# Patient Record
Sex: Male | Born: 1950 | Race: Black or African American | Hispanic: No | Smoking: Current some day smoker
Health system: Southern US, Community
[De-identification: ages and names within clinical notes are randomized; demographics above are authoritative.]

## PROBLEM LIST (undated history)

## (undated) DIAGNOSIS — I10 Essential (primary) hypertension: Secondary | ICD-10-CM

## (undated) DIAGNOSIS — J449 Chronic obstructive pulmonary disease, unspecified: Secondary | ICD-10-CM

## (undated) DIAGNOSIS — E785 Hyperlipidemia, unspecified: Secondary | ICD-10-CM

## (undated) DIAGNOSIS — J9600 Acute respiratory failure, unspecified whether with hypoxia or hypercapnia: Secondary | ICD-10-CM

## (undated) HISTORY — DX: Acute respiratory failure, unspecified whether with hypoxia or hypercapnia: J96.00

## (undated) HISTORY — PX: STOMACH SURGERY: SHX791

## (undated) HISTORY — DX: Essential (primary) hypertension: I10

## (undated) HISTORY — PX: TONSILLECTOMY: SUR1361

## (undated) HISTORY — DX: Chronic obstructive pulmonary disease, unspecified: J44.9

---

## 2000-05-25 ENCOUNTER — Encounter: Admission: RE | Admit: 2000-05-25 | Discharge: 2000-05-25 | Payer: Self-pay | Admitting: *Deleted

## 2004-05-18 ENCOUNTER — Inpatient Hospital Stay (HOSPITAL_COMMUNITY): Admission: EM | Admit: 2004-05-18 | Discharge: 2004-05-24 | Payer: Self-pay | Admitting: Emergency Medicine

## 2005-06-30 ENCOUNTER — Emergency Department (HOSPITAL_COMMUNITY): Admission: EM | Admit: 2005-06-30 | Discharge: 2005-06-30 | Payer: Self-pay | Admitting: Emergency Medicine

## 2008-10-30 ENCOUNTER — Inpatient Hospital Stay (HOSPITAL_COMMUNITY): Admission: EM | Admit: 2008-10-30 | Discharge: 2008-11-04 | Payer: Self-pay | Admitting: Emergency Medicine

## 2008-10-30 ENCOUNTER — Ambulatory Visit: Payer: Self-pay | Admitting: Pulmonary Disease

## 2008-10-31 ENCOUNTER — Encounter (INDEPENDENT_AMBULATORY_CARE_PROVIDER_SITE_OTHER): Payer: Self-pay | Admitting: Emergency Medicine

## 2008-11-20 ENCOUNTER — Ambulatory Visit: Payer: Self-pay | Admitting: Family Medicine

## 2008-11-23 ENCOUNTER — Ambulatory Visit: Payer: Self-pay | Admitting: *Deleted

## 2008-12-01 ENCOUNTER — Telehealth: Payer: Self-pay | Admitting: Emergency Medicine

## 2008-12-07 DIAGNOSIS — I10 Essential (primary) hypertension: Secondary | ICD-10-CM | POA: Insufficient documentation

## 2008-12-07 DIAGNOSIS — J449 Chronic obstructive pulmonary disease, unspecified: Secondary | ICD-10-CM | POA: Insufficient documentation

## 2008-12-07 DIAGNOSIS — J9600 Acute respiratory failure, unspecified whether with hypoxia or hypercapnia: Secondary | ICD-10-CM

## 2008-12-07 DIAGNOSIS — J45909 Unspecified asthma, uncomplicated: Secondary | ICD-10-CM | POA: Insufficient documentation

## 2008-12-08 ENCOUNTER — Ambulatory Visit: Payer: Self-pay | Admitting: Emergency Medicine

## 2008-12-25 ENCOUNTER — Telehealth (INDEPENDENT_AMBULATORY_CARE_PROVIDER_SITE_OTHER): Payer: Self-pay | Admitting: *Deleted

## 2008-12-27 ENCOUNTER — Ambulatory Visit: Payer: Self-pay | Admitting: Emergency Medicine

## 2009-01-10 ENCOUNTER — Ambulatory Visit: Payer: Self-pay | Admitting: Emergency Medicine

## 2009-01-15 ENCOUNTER — Telehealth (INDEPENDENT_AMBULATORY_CARE_PROVIDER_SITE_OTHER): Payer: Self-pay | Admitting: *Deleted

## 2009-02-01 ENCOUNTER — Emergency Department (HOSPITAL_COMMUNITY): Admission: EM | Admit: 2009-02-01 | Discharge: 2009-02-01 | Payer: Self-pay | Admitting: Emergency Medicine

## 2009-02-05 ENCOUNTER — Ambulatory Visit: Payer: Self-pay | Admitting: Emergency Medicine

## 2009-02-15 ENCOUNTER — Telehealth: Payer: Self-pay | Admitting: Internal Medicine

## 2009-03-06 ENCOUNTER — Ambulatory Visit: Payer: Self-pay | Admitting: Family Medicine

## 2009-03-06 ENCOUNTER — Encounter (INDEPENDENT_AMBULATORY_CARE_PROVIDER_SITE_OTHER): Payer: Self-pay | Admitting: Adult Health

## 2009-03-06 LAB — CONVERTED CEMR LAB
Albumin: 4.5 g/dL (ref 3.5–5.2)
CO2: 22 meq/L (ref 19–32)
Cholesterol: 226 mg/dL — ABNORMAL HIGH (ref 0–200)
Creatinine, Ser: 1.29 mg/dL (ref 0.40–1.50)
HDL: 63 mg/dL (ref >39)
LDL Cholesterol: 144 mg/dL — ABNORMAL HIGH (ref 0–99)
Microalb, Ur: 0.77 mg/dL (ref 0.00–1.89)
Potassium: 4.2 meq/L (ref 3.5–5.3)
Total CHOL/HDL Ratio: 3.6
Total Protein: 7.7 g/dL (ref 6.0–8.3)

## 2009-03-14 ENCOUNTER — Ambulatory Visit: Payer: Self-pay | Admitting: Internal Medicine

## 2009-04-09 ENCOUNTER — Ambulatory Visit: Payer: Self-pay | Admitting: Emergency Medicine

## 2009-04-09 DIAGNOSIS — J309 Allergic rhinitis, unspecified: Secondary | ICD-10-CM | POA: Insufficient documentation

## 2009-05-08 ENCOUNTER — Ambulatory Visit: Payer: Self-pay | Admitting: Internal Medicine

## 2009-05-09 ENCOUNTER — Telehealth (INDEPENDENT_AMBULATORY_CARE_PROVIDER_SITE_OTHER): Payer: Self-pay | Admitting: *Deleted

## 2009-06-07 ENCOUNTER — Ambulatory Visit: Payer: Self-pay | Admitting: Emergency Medicine

## 2009-06-27 ENCOUNTER — Ambulatory Visit: Payer: Self-pay | Admitting: Internal Medicine

## 2009-08-27 ENCOUNTER — Telehealth (INDEPENDENT_AMBULATORY_CARE_PROVIDER_SITE_OTHER): Payer: Self-pay | Admitting: *Deleted

## 2009-09-05 ENCOUNTER — Ambulatory Visit: Payer: Self-pay | Admitting: Internal Medicine

## 2009-09-07 ENCOUNTER — Encounter (INDEPENDENT_AMBULATORY_CARE_PROVIDER_SITE_OTHER): Payer: Self-pay | Admitting: Adult Health

## 2009-09-07 ENCOUNTER — Ambulatory Visit: Payer: Self-pay | Admitting: Internal Medicine

## 2009-09-07 LAB — CONVERTED CEMR LAB
ALT: 11 units/L (ref 0–53)
BUN: 19 mg/dL (ref 6–23)
Basophils Absolute: 0 10*3/uL (ref 0.0–0.1)
Basophils Relative: 0 % (ref 0–1)
CO2: 24 meq/L (ref 19–32)
Calcium: 9.5 mg/dL (ref 8.4–10.5)
Cholesterol: 192 mg/dL (ref 0–200)
Eosinophils Relative: 2 % (ref 0–5)
Glucose, Bld: 90 mg/dL (ref 70–99)
HDL: 52 mg/dL (ref >39)
Hemoglobin: 14.8 g/dL (ref 13.0–17.0)
LDL Cholesterol: 117 mg/dL — ABNORMAL HIGH (ref 0–99)
Lymphocytes Relative: 39 % (ref 12–46)
Lymphs Abs: 3.8 10*3/uL (ref 0.7–4.0)
MCHC: 33.5 g/dL (ref 30.0–36.0)
Monocytes Absolute: 0.8 10*3/uL (ref 0.1–1.0)
Neutro Abs: 5.1 10*3/uL (ref 1.7–7.7)
Neutrophils Relative %: 51 % (ref 43–77)
RBC: 4.58 M/uL (ref 4.22–5.81)
Triglycerides: 116 mg/dL (ref <150)
VLDL: 23 mg/dL (ref 0–40)

## 2009-09-10 ENCOUNTER — Ambulatory Visit: Payer: Self-pay | Admitting: Emergency Medicine

## 2009-09-13 ENCOUNTER — Ambulatory Visit (HOSPITAL_COMMUNITY): Admission: RE | Admit: 2009-09-13 | Discharge: 2009-09-13 | Payer: Self-pay | Admitting: Internal Medicine

## 2009-09-19 ENCOUNTER — Telehealth (INDEPENDENT_AMBULATORY_CARE_PROVIDER_SITE_OTHER): Payer: Self-pay | Admitting: *Deleted

## 2009-10-18 ENCOUNTER — Ambulatory Visit: Payer: Self-pay | Admitting: Vascular Surgery

## 2009-11-01 ENCOUNTER — Telehealth (INDEPENDENT_AMBULATORY_CARE_PROVIDER_SITE_OTHER): Payer: Self-pay | Admitting: *Deleted

## 2009-11-01 ENCOUNTER — Encounter: Payer: Self-pay | Admitting: Emergency Medicine

## 2009-11-06 ENCOUNTER — Ambulatory Visit (HOSPITAL_COMMUNITY): Admission: RE | Admit: 2009-11-06 | Discharge: 2009-11-06 | Payer: Self-pay | Admitting: Family Medicine

## 2009-11-16 ENCOUNTER — Telehealth (INDEPENDENT_AMBULATORY_CARE_PROVIDER_SITE_OTHER): Payer: Self-pay | Admitting: *Deleted

## 2009-12-10 ENCOUNTER — Ambulatory Visit: Payer: Self-pay | Admitting: Emergency Medicine

## 2009-12-26 ENCOUNTER — Emergency Department (HOSPITAL_COMMUNITY): Admission: EM | Admit: 2009-12-26 | Discharge: 2009-12-26 | Payer: Self-pay | Admitting: Emergency Medicine

## 2009-12-31 ENCOUNTER — Telehealth: Payer: Self-pay | Admitting: Emergency Medicine

## 2010-01-02 ENCOUNTER — Ambulatory Visit (HOSPITAL_BASED_OUTPATIENT_CLINIC_OR_DEPARTMENT_OTHER): Admission: RE | Admit: 2010-01-02 | Discharge: 2010-01-02 | Payer: Self-pay | Admitting: Emergency Medicine

## 2010-01-02 ENCOUNTER — Ambulatory Visit: Payer: Self-pay | Admitting: Pulmonary Disease

## 2010-01-15 ENCOUNTER — Ambulatory Visit (HOSPITAL_COMMUNITY)
Admission: RE | Admit: 2010-01-15 | Discharge: 2010-01-15 | Payer: Self-pay | Source: Home / Self Care | Admitting: Internal Medicine

## 2010-01-15 ENCOUNTER — Encounter: Payer: Self-pay | Admitting: Internal Medicine

## 2010-02-16 ENCOUNTER — Emergency Department (HOSPITAL_COMMUNITY)
Admission: EM | Admit: 2010-02-16 | Discharge: 2010-02-16 | Payer: Self-pay | Source: Home / Self Care | Admitting: Emergency Medicine

## 2010-02-18 ENCOUNTER — Ambulatory Visit: Payer: Self-pay | Admitting: Emergency Medicine

## 2010-02-21 DIAGNOSIS — J189 Pneumonia, unspecified organism: Secondary | ICD-10-CM | POA: Insufficient documentation

## 2010-02-28 ENCOUNTER — Ambulatory Visit
Admission: RE | Admit: 2010-02-28 | Discharge: 2010-02-28 | Payer: Self-pay | Source: Home / Self Care | Attending: Emergency Medicine | Admitting: Emergency Medicine

## 2010-02-28 ENCOUNTER — Ambulatory Visit: Payer: Self-pay | Admitting: Emergency Medicine

## 2010-04-04 NOTE — Progress Notes (Signed)
Summary: refill  Phone Note Call from Patient Call back at Home Phone [phone]   Caller: Patient Call For: byrum Summary of Call: pt says that [facility] told him they had faxed a request for refill of symbicort.  Initial call taken by: Tivis Ringer, CNA,  December 31, 2009 5:03 PM  Follow-up for Phone Call        refill sent. pt aware. Carron Curie CMA  December 31, 2009 5:19 PM     Prescriptions: SYMBICORT 160-4.5 MCG/ACT AERO (BUDESONIDE-FORMOTEROL FUMARATE) 2 puffs two times a day  #1 x 11   Entered by:   Carron Curie CMA   Authorized by:   Leslye Peer MD   Signed by:   Carron Curie CMA on 12/31/2009   Method used:   Faxed to ...       [facility] - [facility] (retail)       [address] [place], [place]  [address]       Ph: [phone]       Fax: [phone]   RxID:   [id number]

## 2010-04-04 NOTE — Letter (Signed)
Summary: External Correspondence  External Correspondence   Imported By: Lehman Prom 11/01/2009 15:02:39  _____________________________________________________________________  External Attachment:    Type:   Image     Comment:   External Document

## 2010-04-04 NOTE — Assessment & Plan Note (Signed)
Summary: COPD, GERD   Visit Type:  Follow-up Primary Provider/Referring Provider:  [name], [facility]  CC:  COPD.  The patient says his breathing is worse in the mornings. He notices the SOB when still lying in bed. Patient is smoking again.Marland Kitchen  History of Present Illness: 10 hx current tobacco, COPD +/- asthma, HTN. Little other PMH.   ROV 01/10/09 -- Returns for scheduled follow up, has been doing very well. Taking the Spiriva + Symbicort. He feels much better on this regimen. Able to walk farther. Using rescue inhaler MUCH less than last time. Down to 2 cigarettes a day.   ROV 02/05/09 -- Seen back today, earlier than originally planned, after an asthma flare that sent him to the ED 12/3. Was experiencing several periods of wheezing a day, was using ventolin 6 times a day. He has been around a smoker every day, states he quit 2 weeks ago. He was sengt out on a quick prednisone taper. Much improved now.   ROV 04/09/09 -- returns feeling well, breathing has been stable since he was treated for an AE in early January (pred + azithro). May have also had thrush, treated with fluconazole. He had an episode of acute dyspnea following exposure to bug spray about a week ago. No tobacco. Some cough with clear thick sputum.   ROV 06/07/09 -- follow up for COPD. He tells me he was treated with prednisone for a flare in March. He is on Spiriva and Symbicort, but has run out of Spiriva for 3 days. He has to get his social services card renewed, planning to get this done today. He is feeling well now, good exertional tolerance. Quit smoking 2 weeks ago!!   ROV 09/10/09 -- Regular f/u for COPD. He has had nocturnal awakenings with SOB. Often assoc with reflux symptoms. He has also been having syncopal episodes, planning for vascular eval, cardiology eval. Nocturnal cough, wakes up choking. Still can have episodes of SOB with exertion. Back to smoking 5 cig/day. No exacerbations since last visit.   Preventive  Screening-Counseling & Management  Alcohol-Tobacco     Smoking Status: current     Packs/Day: <0.25  Current Medications (verified): 1)  Ventolin Hfa 108 (90 Base) Mcg/act Aers (Albuterol Sulfate) .Marland Kitchen.. 1-2 Puffs Every 4-6 Hours As Needed 2)  Amlodipine Besylate 10 Mg Tabs (Amlodipine Besylate) .... Once Daily 3)  Hydrochlorothiazide 25 Mg Tabs (Hydrochlorothiazide) .... Once Daily 4)  Spiriva Handihaler 18 Mcg Caps (Tiotropium Bromide Monohydrate) .... Out of Medication 5)  Famotidine 20 Mg Tabs (Famotidine) .... Two Times A Day 6)  Symbicort 160-4.5 Mcg/act Aero (Budesonide-Formoterol Fumarate) .... 2 Puffs Two Times A Day 7)  Nasacort Aq 55 Mcg/act Aers (Triamcinolone Acetonide(Nasal)) .... 2 Sprays in Each Nostril Daily As Needed  Allergies (verified): No Known Drug Allergies  Vital Signs:  Patient profile:   60 year old male Height:      72 inches (182.88 cm) Weight:      176.38 pounds (80.17 kg) BMI:     24.01 O2 Sat:      93 % on Room air Temp:     97.5 degrees F (36.39 degrees C) oral Pulse rate:   82 / minute BP sitting:   124 / 78  (right arm) Cuff size:   regular  Vitals Entered By: [name] CMA (September 10, 2009 2:06 PM)  O2 Sat at Rest %:  93 O2 Flow:  Room air CC: COPD.  The patient says his breathing is  worse in the mornings. He notices the SOB when still lying in bed. Patient is smoking again. Comments Medications reviewed. Daytime phone verified. [name] [facility]  September 10, 2009 2:07 PM   Physical Exam  General:  normal appearance, healthy appearing, and thin.   Head:  normocephalic and atraumatic Eyes:  sclera injected Nose:  no deformity, discharge, inflammation, or lesions Mouth:  no deformity or lesions Neck:  no masses, thyromegaly, or abnormal cervical nodes Lungs:  distant, no wheezes Heart:  regular rate and rhythm, S1, S2 without murmurs, rubs, gallops, or clicks Abdomen:  not examined Msk:  no deformity or scoliosis noted with normal  posture Extremities:  no clubbing, cyanosis, edema, or deformity noted Neurologic:  non-focal Skin:  intact without lesions or rashes Psych:  alert and cooperative; normal mood and affect; normal attention span and concentration   Medications Added to Medication List This Visit: 1)  Omeprazole 20 Mg Cpdr (Omeprazole) .Marland Kitchen.. 1 by mouth once daily  Other Orders: Est. Patient Level IV ([address])  Patient Instructions: 1)  Continue your Spiriva and Symbicort  2)  Use your ventolin as needed  3)  Stop famotidine and start omeprazole 20mg  once daily until our next visit.  4)  Continue your nasacort 5)  Follow with Dr Delton Coombes in 3 months or as needed.  Prescriptions: OMEPRAZOLE 20 MG CPDR (OMEPRAZOLE) 1 by mouth once daily  #30 x 11   Entered and Authorized by:   Leslye Peer MD   Signed by:   Leslye Peer MD on 09/10/2009   Method used:   Faxed to ...       [facility] - [facility] (retail)       [address] [place], [place]  [address]       Ph: [phone]       Fax: [phone]   RxID:   [id number]

## 2010-04-04 NOTE — Progress Notes (Signed)
Summary: Records request from [facility] of [facility]  Request for records received from [facility]. Request forwarded to [facility]. [name]  May 09, 2009 12:37 PM

## 2010-04-04 NOTE — Progress Notes (Signed)
Summary: Papers from law office  Phone Note Other Incoming   Caller: [employer] [phone] [place] Summary of Call: calling to see if you are going to fill out form sent in july  Initial call taken by: Lacinda Axon,  November 01, 2009 12:37 PM  Follow-up for Phone Call        wants to know if RB is going to fill paper out or not, if not they just need to know this if he is they just want to know the expected return date of the form Follow-up by: Philipp Deputy CMA,  November 01, 2009 2:25 PM  Additional Follow-up for Phone Call Additional follow up Details #1::        Dr. Delton Coombes has filled the forms out. I faxed them to [facility] at fax# [phone] and have also faxed the originals to the law office at [address] [place], [place] [address]. A copy was scanned into the EMR. Additional Follow-up by: [name] CMA,  November 01, 2009 3:20 PM

## 2010-04-04 NOTE — Progress Notes (Signed)
Summary: ventolin sample x1  Phone Note Call from Patient Call back at Home Phone [phone]   Caller: Patient Call For: byrum Summary of Call: pt wants a sample of ventolin HFA  Initial call taken by: Tivis Ringer, CNA,  November 16, 2009 10:58 AM  Follow-up for Phone Call        1 sample of ventolin left up front for pt to pick up at his convenience.  pt is aware.  next appt w/ RB 10.10.11. Boone Master CNA/MA  November 16, 2009 11:10 AM

## 2010-04-04 NOTE — Assessment & Plan Note (Signed)
Summary: COPD   Visit Type:  Follow-up Primary Provider/Referring Provider:  [name], [facility]  CC:  COPD. The patient states his breathing may be slightly better. He does still c/o sob with exertion.Marland Kitchen  History of Present Illness: 53 hx current tobacco, COPD and possible asthma, HTN. Little other PMH.   Hospitalized 8/30 - 9/4 for VDRF in setting asthma exacerbation. He has done fairly well since d/c, breathing OK although he has to stop to rest after 50 - 60 yrds of walking. Coughs frequently, bothers him the most at night, also has more dyspnea at night. Occasionally prod clear thick mucous. He is currently using Spiriva once daily, and combivent as needed. Ran out of Ventolin, but was using this as needed as well. Triggers include perfume or cologne, some smoke.   Acute visit 12/27/08 -- Returns describing worsen of breathing last week after ha ran out of both his Spiriva and Ventolin. He had paroxyms of cough and passed out on one occasion. Was using ventolin every 2 hours. Restarted Spiriva 2 days ago and now he is much improved.   ROV 01/10/09 -- Returns for scheduled follow up, has been doing very well. Taking the Spiriva + Symbicort. He feels much better on this regimen. Able to walk farther. Using rescue inhaler MUCH less than last time. Down to 2 cigarettes a day.   ROV 02/05/09 -- Seen back today, earlier than originally planned, after an asthma flare that sent him to the ED 12/3. Was experiencing several periods of wheezing a day, was using ventolin 6 times a day. He has been around a smoker every day, states he quit 2 weeks ago. He was sengt out on a quick prednisone taper. Much improved now.   ROV 04/09/09 -- returns feeling well, breathing has been stable since he was treated for an AE in early January (pred = azithro). May have also had thrush, treated with fluconazole. He had an episode of acute dyspnea following exposure to bug spray about a week ago. No tobacco. Some cough  with clear thick sputum.    Current Medications (verified): 1)  Ventolin Hfa 108 (90 Base) Mcg/act Aers (Albuterol Sulfate) .Marland Kitchen.. 1-2 Puffs Every 4-6 Hours As Needed 2)  Amlodipine Besylate 10 Mg Tabs (Amlodipine Besylate) .... Once Daily 3)  Combivent 103-18 Mcg/act Aero (Ipratropium-Albuterol) .... 2 Puffs Qid 4)  Hydrochlorothiazide 25 Mg Tabs (Hydrochlorothiazide) .... Once Daily 5)  Spiriva Handihaler 18 Mcg Caps (Tiotropium Bromide Monohydrate) .... Once Daily 6)  Famotidine 20 Mg Tabs (Famotidine) .... Two Times A Day 7)  Symbicort 160-4.5 Mcg/act Aero (Budesonide-Formoterol Fumarate) .... 2 Puffs Two Times A Day 8)  Nasacort Aq 55 Mcg/act Aers (Triamcinolone Acetonide(Nasal)) .... 2 Sprays in Each Nostril Daily As Needed  Allergies (verified): No Known Drug Allergies  Vital Signs:  Patient profile:   60 year old male Height:      72 inches (182.88 cm) Weight:      175.25 pounds (79.66 kg) BMI:     23.85 O2 Sat:      97 % on Room air Temp:     97.5 degrees F (36.39 degrees C) oral Pulse rate:   92 / minute BP sitting:   116 / 80  (left arm) Cuff size:   regular  Vitals Entered By: [name] CMA (April 09, 2009 1:40 PM)  O2 Sat at Rest %:  97 O2 Flow:  Room air  Physical Exam  General:  normal appearance, healthy appearing, and thin.  Head:  normocephalic and atraumatic Eyes:  sclera injected Nose:  no deformity, discharge, inflammation, or lesions Mouth:  no deformity or lesions Neck:  no masses, thyromegaly, or abnormal cervical nodes Lungs:  distant, no wheezes Heart:  regular rate and rhythm, S1, S2 without murmurs, rubs, gallops, or clicks Abdomen:  not examined Msk:  no deformity or scoliosis noted with normal posture Extremities:  no clubbing, cyanosis, edema, or deformity noted Neurologic:  non-focal Skin:  intact without lesions or rashes Psych:  alert and cooperative; normal mood and affect; normal attention span and concentration   Impression &  Recommendations:  Problem # 1:  COPD (ICD-496) Severe COPD, exacerbation in 1/11 but now back to baseline - continue Spiriva + symbicort + ventolin as needed - will call his attorney's office regarding disability claim per his request: Tamala Bari and Lilian Kapur, [phone]  Problem # 2:  HYPERTENSION (ICD-401.9)  His updated medication list for this problem includes:    Amlodipine Besylate 10 Mg Tabs (Amlodipine besylate) ..... Once daily    Hydrochlorothiazide 25 Mg Tabs (Hydrochlorothiazide) ..... Once daily  Problem # 3:  ALLERGIC RHINITIS (ICD-477.9) Started on nasal steroid, seems to be helping His updated medication list for this problem includes:    Nasacort Aq 55 Mcg/act Aers (Triamcinolone acetonide(nasal)) .Marland Kitchen... 2 sprays in each nostril daily as needed  Medications Added to Medication List This Visit: 1)  Nasacort Aq 55 Mcg/act Aers (Triamcinolone acetonide(nasal)) .... 2 sprays in each nostril daily as needed  Other Orders: Est. Patient Level IV (11914)  Patient Instructions: 1)  Continue Spiriva and Symbicort. Refills for Spiriva were sent to the pharmacy. 2)  Continue albuterol as needed  3)  Continue Nasacort as you are taking it.  4)  Follow up with Dr Delton Coombes in 3 months or as needed.  Prescriptions: SPIRIVA HANDIHALER 18 MCG CAPS (TIOTROPIUM BROMIDE MONOHYDRATE) once daily  #30 x 11   Entered and Authorized by:   Leslye Peer MD   Signed by:   Leslye Peer MD on 04/09/2009   Method used:   Faxed to ...       [facility] - [facility] (retail)       [address] [place], [place]  [address]       Ph: [phone]       Fax: [phone]   RxID:   [id number] VENTOLIN HFA 108 (90 BASE) MCG/ACT AERS (ALBUTEROL SULFATE) 1-2 puffs every 4-6 hours as needed  #1 x 11   Entered and Authorized by:   Leslye Peer MD   Signed by:   Leslye Peer MD on 04/09/2009   Method used:   Faxed to ...       [facility] -  Ph[facility] (retail)       [address] [place], [place]  [address]       Ph: [phone]       Fax: [phone]   RxID:   [id number]

## 2010-04-04 NOTE — Assessment & Plan Note (Signed)
Summary: COPD, ? OSA   Visit Type:  Follow-up Primary Provider/Referring Provider:  [name], [facility]  CC:  COPD follow-up...the patient c/o increased SOB when lying down at night...using his Ventolin inhaler 4 to 5 times daily since the heat has been turned on indoors..  History of Present Illness: 73 hx current tobacco, COPD +/- asthma, HTN. Little other PMH. Has been maintained on Spiriva + Symbicort since 12/2008.   ROV 06/07/09 -- follow up for COPD. He tells me he was treated with prednisone for a flare in March. He is on Spiriva and Symbicort, but has run out of Spiriva for 3 days. He has to get his social services card renewed, planning to get this done today. He is feeling well now, good exertional tolerance. Quit smoking 2 weeks ago!!   ROV 09/10/09 -- Regular f/u for COPD. He has had nocturnal awakenings with SOB. Often assoc with reflux symptoms. He has also been having syncopal episodes, planning for vascular eval, cardiology eval. Nocturnal cough, wakes up choking. Still can have episodes of SOB with exertion. Back to smoking 5 cig/day. No exacerbations since last visit.   ROV 12/10/09 -- scheduled f/u COPD, dyspnea on exertional and at night. Tells me that he continues to have nocturnal awakenings. These episodes are not reliably relieved by BD. ? snoring, hx possible apneas. Napping daily. Using frequent Ventolin, 4 -5 x a day. Last time we started protonix, his GERD and belching are both better. States thaqt he stopped smoking after our last visit (July), but he does smell of smoke. 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 Holter monitoring performed but he doesn';t know the results yet.    Current Medications (verified): 1)  Ventolin Hfa 108 (90 Base) Mcg/act Aers (Albuterol Sulfate) .Marland Kitchen.. 1-2 Puffs Every 4-6 Hours As Needed 2)  Amlodipine Besylate 10 Mg Tabs (Amlodipine Besylate) .... Once Daily 3)  Hydrochlorothiazide 25 Mg Tabs (Hydrochlorothiazide) .... Once  Daily 4)  Spiriva Handihaler 18 Mcg Caps (Tiotropium Bromide Monohydrate) .... Out of Medication 5)  Symbicort 160-4.5 Mcg/act Aero (Budesonide-Formoterol Fumarate) .... 2 Puffs Two Times A Day 6)  Nasacort Aq 55 Mcg/act Aers (Triamcinolone Acetonide(Nasal)) .... 2 Sprays in Each Nostril Daily As Needed 7)  Protonix 40 Mg Tbec (Pantoprazole Sodium) .Marland Kitchen.. 1 By Mouth Daily  Allergies (verified): No Known Drug Allergies  Vital Signs:  Patient profile:   60 year old male Height:      72 inches (182.88 cm) Weight:      176.13 pounds (80.06 kg) BMI:     23.97 O2 Sat:      95 % on Room air Temp:     97.7 degrees F (36.50 degrees C) oral Pulse rate:   75 / minute BP sitting:   116 / 70  (left arm) Cuff size:   regular  Vitals Entered By: [name] CMA (December 10, 2009 8:47 AM)  O2 Sat at Rest %:  95 O2 Flow:  Room air CC: COPD follow-up...the patient c/o increased SOB when lying down at night...using his Ventolin inhaler 4 to 5 times daily since the heat has been turned on indoors. Comments Medications reviewed with patient Daytime phone verified. [name] CMA  December 10, 2009 8:56 AM   Physical Exam  General:  normal appearance, healthy appearing, and thin.   Head:  normocephalic and atraumatic Eyes:  sclera injected Nose:  no deformity, discharge, inflammation, or lesions Mouth:  no deformity or lesions Neck:  no masses, thyromegaly,  or abnormal cervical nodes Lungs:  distant, no wheezes today.  Heart:  regular rate and rhythm, S1, S2 without murmurs, rubs, gallops, or clicks Abdomen:  not examined Msk:  no deformity or scoliosis noted with normal posture Extremities:  no clubbing, cyanosis, edema, or deformity noted Neurologic:  non-focal Skin:  intact without lesions or rashes Psych:  alert and cooperative; normal mood and affect; normal attention span and concentration   Impression & Recommendations:  Problem # 1:  COPD (ICD-496) - continue Spiriva +  Symbicort - Ventolin q4h as needed  - pt doesn't want flu shot or pneumovax today  Problem # 2:  ALLERGIC RHINITIS (ICD-477.9)  His updated medication list for this problem includes:    Nasacort Aq 55 Mcg/act Aers (Triamcinolone acetonide(nasal)) .Marland Kitchen... 2 sprays in each nostril daily as needed  Problem # 3:  ? of SLEEP APNEA (ICD-780.57)  - willing to get PSG  Orders: Est. Patient Level IV ([address]) Sleep Disorder Referral (Sleep Disorder)  Medications Added to Medication List This Visit: 1)  Protonix 40 Mg Tbec (Pantoprazole sodium) .Marland Kitchen.. 1 by mouth daily  Patient Instructions: 1)  Continue your Spiriva and Symbicort 2)  Use ventolin up to every 4 hours if needed for shortness of breath.  3)  We will schedule a sleep study 4)  Take Protonix 40mg  by mouth once daily  5)  Follow up with Dr Delton Coombes in 6 weeks (after your sleep study).

## 2010-04-04 NOTE — Assessment & Plan Note (Signed)
Summary: COPD   Visit Type:  Follow-up Primary Provider/Referring Provider:  [name], [facility]  CC:  Followup COPD.  Pt recently seen by TP for post ER visit after being dxed with PNA.  Today he states breathing is ack to his baseline.  He is c/o cough- worse at night "feels like throat is closing"- states that sometimes "feels like passing out from cough".  .  History of Present Illness: 42 hx current tobacco, COPD +/- asthma, HTN. Little other PMH. Has been maintained on Spiriva + Symbicort since 12/2008.   ROV 09/10/09 -- Regular f/u for COPD. He has had nocturnal awakenings with SOB. Often assoc with reflux symptoms. He has also been having syncopal episodes, planning for vascular eval, cardiology eval. Nocturnal cough, wakes up choking. Still can have episodes of SOB with exertion. Back to smoking 5 cig/day. No exacerbations since last visit.   ROV 12/10/09 -- scheduled f/u COPD, dyspnea on exertional and at night. Tells me that he continues to have nocturnal awakenings. These episodes are not reliably relieved by BD. ? snoring, hx possible apneas. Napping daily. Using frequent Ventolin, 4 -5 x a day. Last time we started protonix, his GERD and belching are both better. States thaqt he stopped smoking after our last visit (July), but he does smell of smoke. 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 Holter monitoring performed but he doesn';t know the results yet.   February 18, 2010 --Preesnts for ER follow up. Went to ER 12.17.11, diagnosed w/ PNA.  Was given amoxicillin and doxycycline.  states feels better but still having some prod cough with yellow mucus,   Gets meds thru [facility]. Xray showed a probable RUL PNA. He is feeling better. Denies chest pain,  orthopnea, hemoptysis, fever, n/v/d, edema, headache.  ROV 02/28/10 -- f/u severe COPD/asthma, still smoking occas cigarette. Was treated for CAP 02/16/10. Returns today w f/u CXR, also to review PSG. He tells me that  his cough is better, no more sputum. Has wheeze after walking a city block. He continues to have episodes of syncope, no clear etiology. His PSG shows AHI 3, RDI 10.5 (done 01/02/10).   Current Medications (verified): 1)  Amlodipine Besylate 10 Mg Tabs (Amlodipine Besylate) .... Once Daily 2)  Hydrochlorothiazide 25 Mg Tabs (Hydrochlorothiazide) .... Once Daily 3)  Spiriva Handihaler 18 Mcg Caps (Tiotropium Bromide Monohydrate) .... Inhale Contents of 1 Capsule Once A Day 4)  Symbicort 160-4.5 Mcg/act Aero (Budesonide-Formoterol Fumarate) .... 2 Puffs Two Times A Day 5)  Nasacort Aq 55 Mcg/act Aers (Triamcinolone Acetonide(Nasal)) .... 2 Sprays in Each Nostril Daily As Needed 6)  Protonix 40 Mg Tbec (Pantoprazole Sodium) .Marland Kitchen.. 1 By Mouth Daily 7)  Ventolin Hfa 108 (90 Base) Mcg/act Aers (Albuterol Sulfate) .Marland Kitchen.. 1-2 Puffs Every 4-6 Hours As Needed  Allergies (verified): No Known Drug Allergies  Vital Signs:  Patient profile:   60 year old male Weight:      175.38 pounds O2 Sat:      96 % on Room air Temp:     97.7 degrees F oral Pulse rate:   78 / minute BP sitting:   94 / 72  (left arm)  Vitals Entered By: Vernie Murders (February 28, 2010 2:07 PM)  O2 Flow:  Room air  Physical Exam  General:  normal appearance, healthy appearing, and thin.   Head:  normocephalic and atraumatic Eyes:  sclera injected Nose:  no deformity, discharge, inflammation, or lesions Mouth:  no deformity or lesions  Neck:  no masses, thyromegaly, or abnormal cervical nodes Lungs:  distant, no wheezes today.  Heart:  regular rate and rhythm, S1, S2 without murmurs, rubs, gallops, or clicks Abdomen:  not examined Msk:  no deformity or scoliosis noted with normal posture Extremities:  no clubbing, cyanosis, edema, or deformity noted Neurologic:  non-focal Skin:  intact without lesions or rashes Psych:  alert and cooperative; normal mood and affect; normal attention span and concentration   Impression &  Recommendations:  Problem # 1:  COPD (ICD-496)  Problem # 2:  ? 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 At this time doubt we need to treat, will reconsider if clinical status changes.   Orders: Est. Patient Level IV ([address])  Other Orders: T-2 View CXR ([id number])  Patient Instructions: 1)  Please continue your Spiriva and Symbicort 2)  Use Ventolin as needed  3)  Continue Nasocort 4)  Follow up with Dr Delton Coombes in 3 months or as needed

## 2010-04-04 NOTE — Assessment & Plan Note (Signed)
Summary: NP follow up - ER follow up / PNA   Primary Provider/Referring Provider:  Willis Modena, [facility]  CC:  went to ER 12.17.11 and diagnosed w/ PNA.  was given amoxicillin and doxycycline.  states feels better.  History of Present Illness: 60 hx current tobacco, COPD +/- asthma, HTN. Little other PMH. Has been maintained on Spiriva + Symbicort since 12/2008.   ROV 06/07/09 -- follow up for COPD. He tells me he was treated with prednisone for a flare in March. He is on Spiriva and Symbicort, but has run out of Spiriva for 3 days. He has to get his social services card renewed, planning to get this done today. He is feeling well now, good exertional tolerance. Quit smoking 2 weeks ago!!   ROV 09/10/09 -- Regular f/u for COPD. He has had nocturnal awakenings with SOB. Often assoc with reflux symptoms. He has also been having syncopal episodes, planning for vascular eval, cardiology eval. Nocturnal cough, wakes up choking. Still can have episodes of SOB with exertion. Back to smoking 5 cig/day. No exacerbations since last visit.   ROV 12/10/09 -- scheduled f/u COPD, dyspnea on exertional and at night. Tells me that he continues to have nocturnal awakenings. These episodes are not reliably relieved by BD. ? snoring, hx possible apneas. Napping daily. Using frequent Ventolin, 4 -5 x a day. Last time we started protonix, his GERD and belching are both better. States thaqt he stopped smoking after our last visit (July), but he does smell of smoke. 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 Holter monitoring performed but he doesn';t know the results yet.   February 18, 2010 --Preesnts for ER follow up. Went to ER 12.17.11, diagnosed w/ PNA.  Was given amoxicillin and doxycycline.  states feels better but still having some prod cough with yellow mucus,   Gets meds thru [facility]. Xray showed a probable RUL PNA. He is feeling better. Denies chest pain,  orthopnea, hemoptysis, fever, n/v/d,  edema, headache.  Medications Prior to Update: 1)  Amlodipine Besylate 10 Mg Tabs (Amlodipine Besylate) .... Once Daily 2)  Hydrochlorothiazide 25 Mg Tabs (Hydrochlorothiazide) .... Once Daily 3)  Spiriva Handihaler 18 Mcg Caps (Tiotropium Bromide Monohydrate) .... Out of Medication 4)  Symbicort 160-4.5 Mcg/act Aero (Budesonide-Formoterol Fumarate) .... 2 Puffs Two Times A Day 5)  Nasacort Aq 55 Mcg/act Aers (Triamcinolone Acetonide(Nasal)) .... 2 Sprays in Each Nostril Daily As Needed 6)  Protonix 40 Mg Tbec (Pantoprazole Sodium) .Marland Kitchen.. 1 By Mouth Daily 7)  Ventolin Hfa 108 (90 Base) Mcg/act Aers (Albuterol Sulfate) .Marland Kitchen.. 1-2 Puffs Every 4-6 Hours As Needed  Current Medications (verified): 1)  Ventolin Hfa 108 (90 Base) Mcg/act Aers (Albuterol Sulfate) .Marland Kitchen.. 1-2 Puffs Every 4-6 Hours As Needed 2)  Amlodipine Besylate 10 Mg Tabs (Amlodipine Besylate) .... Once Daily 3)  Hydrochlorothiazide 25 Mg Tabs (Hydrochlorothiazide) .... Once Daily 4)  Spiriva Handihaler 18 Mcg Caps (Tiotropium Bromide Monohydrate) .... Out of Medication 5)  Symbicort 160-4.5 Mcg/act Aero (Budesonide-Formoterol Fumarate) .... 2 Puffs Two Times A Day 6)  Nasacort Aq 55 Mcg/act Aers (Triamcinolone Acetonide(Nasal)) .... 2 Sprays in Each Nostril Daily As Needed 7)  Protonix 40 Mg Tbec (Pantoprazole Sodium) .Marland Kitchen.. 1 By Mouth Daily  Allergies (verified): No Known Drug Allergies  Past History:  Past Medical History: Last updated: 12/07/2008 ACUTE RESPIRATORY FAILURE (ICD-518.81) ASTHMA (ICD-493.90) COPD (ICD-496) HYPERTENSION (ICD-401.9)  Past Surgical History: Last updated: 12/08/2008 Tonsillectomy  Family History: Last updated: 12/08/2008 allergies-sister heart disease-brother  Social History: Last updated: 02/18/2010 Patient is a current smoker.   2-3ppd x60yrs.  currently smoking 1 cig a day. 1 daughter unemployed, used to work at [employer], wore a mask.  staying with a friend, around 2nd hand  smoke as well declines flu shot 12.19.11  Risk Factors: Smoking Status: current (09/10/2009) Packs/Day: <0.25 (09/10/2009)  Social History: Patient is a current smoker.   2-3ppd x60yrs  currently smoking 1 cig a day. 1 daughter unemployed, used to work at [employer], wore a mask.  staying with a friend, around 2nd hand smoke as well declines flu shot 12.19.11  Review of Systems      See HPI  Vital Signs:  Patient profile:   60 year old male Height:      72 inches Weight:      179 pounds BMI:     24.36 O2 Sat:      92 % on Room air Temp:     97.1 degrees F oral Pulse rate:   66 / minute BP sitting:   126 / 74  (left arm) Cuff size:   regular  Vitals Entered By: Boone Master CNA/MA (February 18, 2010 4:40 PM)  O2 Flow:  Room air CC: went to ER 12.17.11, diagnosed w/ PNA.  was given amoxicillin and doxycycline.  states feels better Is Patient Diabetic? No Comments Medications reviewed with patient Daytime contact number verified with patient.Boone Master CNA/MA  February 18, 2010 4:40 PM    Physical Exam  Additional Exam:  GEN: A/Ox3; pleasant , NAD HEENT:  [place]/AT, , EACs-clear, TMs-wnl, NOSE-clear, THROAT-clear NECK:  Supple w/ fair ROM; no JVD; normal carotid impulses w/o bruits; no thyromegaly or nodules palpated; no lymphadenopathy. RESP  Coarse BS w/ no wheezing  CARD:  RRR, no m/r/g   GI:   Soft & nt; nml bowel sounds; no organomegaly or masses detected. [place]: Warm bil,  no calf tenderness edema, clubbing, pulses intact Neuro: intact w/ no focal deficits noted.    Impression & Recommendations:  Problem # 1:  PNEUMONIA (ICD-486)  RUL opacity c/w PNA -clinically improving.  Plan:  Finish Antibiotics.  Mucinex DM two times a day as needed cough/congestion Increase fluids and rest  follow up Dr. Delton Coombes as planned  with chest xray (or [facility] NP) Please contact office for sooner follow up if symptoms do not improve or worsen   Orders: Est.  Patient Level II ([address])  Medications Added to Medication List This Visit: 1)  Spiriva Handihaler 18 Mcg Caps (Tiotropium bromide monohydrate) .... Inhale contents of 1 capsule once a day 2)  Ventolin Hfa 108 (90 Base) Mcg/act Aers (Albuterol sulfate) .Marland Kitchen.. 1-2 puffs every 4-6 hours as needed  Complete Medication List: 1)  Amlodipine Besylate 10 Mg Tabs (Amlodipine besylate) .... Once daily 2)  Hydrochlorothiazide 25 Mg Tabs (Hydrochlorothiazide) .... Once daily 3)  Spiriva Handihaler 18 Mcg Caps (Tiotropium bromide monohydrate) .... Inhale contents of 1 capsule once a day 4)  Symbicort 160-4.5 Mcg/act Aero (Budesonide-formoterol fumarate) .... 2 puffs two times a day 5)  Nasacort Aq 55 Mcg/act Aers (Triamcinolone acetonide(nasal)) .... 2 sprays in each nostril daily as needed 6)  Protonix 40 Mg Tbec (Pantoprazole sodium) .Marland Kitchen.. 1 by mouth daily 7)  Ventolin Hfa 108 (90 Base) Mcg/act Aers (Albuterol sulfate) .Marland Kitchen.. 1-2 puffs every 4-6 hours as needed  Patient Instructions: 1)  Finish Antibiotics.  2)  Mucinex DM two times a day as needed cough/congestion 3)  Increase fluids and rest  4)  follow up Dr. Delton Coombes as planned  with chest xray (or [facility]rett NP) 5)  Please contact office for sooner follow up if symptoms do not improve or worsen  Prescriptions: VENTOLIN HFA 108 (90 BASE) MCG/ACT AERS (ALBUTEROL SULFATE) 1-2 puffs every 4-6 hours as needed  #1 x 3   Entered by:   Boone Master CNA/MA   Authorized by:   Rubye Oaks NP   Signed by:   Boone Master CNA/MA on 02/18/2010   Method used:   Faxed to ...       [facility] (retail)       [address] [place], [place]  [address]       Ph: [phone]       Fax: [phone]   RxID:   [id number] SPIRIVA HANDIHALER 18 MCG CAPS (TIOTROPIUM BROMIDE MONOHYDRATE) Inhale contents of 1 capsule once a day  #30 x 3   Entered by:   Boone Master CNA/MA   Authorized by:   Rubye Oaks NP   Signed by:    Boone Master CNA/MA on 02/18/2010   Method used:   Faxed to ...       [facility] (retail)       [address] [place], [place]  [address]       Ph: [phone]       Fax: [phone]   RxID:   [id number]

## 2010-04-04 NOTE — Progress Notes (Signed)
Summary: fainting spells  Phone Note Call from Patient   Caller: Patient Call For: byrum Summary of Call: pt want dr to know he has been fainting . not sure if it is coming from b/p or lungs Initial call taken by: Rickard Patience,  August 27, 2009 3:08 PM  Follow-up for Phone Call        Spoke with pt.  He states that ever since hospital d/c back in Nov 2010, he has had "blackouts" on and off- worse over the past 2 wks.  He states that he has fainted 3 times in the past 2 wks.  He states that he has not had any changes in breathing except maybe being out in the heat makes breathing worse.  Pt states that he is unsure of what is causing these episoded but wanted RB to know. He states that he has already contanted his PCP today regarding this and is still awaiting a call back.  Will forward to RB. Follow-up by: Vernie Murders,  August 27, 2009 3:22 PM

## 2010-04-04 NOTE — Progress Notes (Signed)
  Phone Note Other Incoming   Request: Send information Summary of Call: Request for records received from [facility]. Request forwarded to [facility].     

## 2010-04-04 NOTE — Assessment & Plan Note (Signed)
Summary: COPD   Visit Type:  Follow-up Primary Provider/Referring Provider:  [name], [facility]  CC:  COPD.  The patient says his breathing has improved. He has not been taking Spiriva everyday and has been out of this med for 3 days. Only get short of breath with lots of exertion.Marland Kitchen  History of Present Illness: 41 hx current tobacco, COPD and possible asthma, HTN. Little other PMH.   Hospitalized 8/30 - 9/4 for VDRF in setting asthma exacerbation. He has done fairly well since d/c, breathing OK although he has to stop to rest after 50 - 60 yrds of walking. Coughs frequently, bothers him the most at night, also has more dyspnea at night. Occasionally prod clear thick mucous. He is currently using Spiriva once daily, and combivent as needed. Ran out of Ventolin, but was using this as needed as well. Triggers include perfume or cologne, some smoke.   Acute visit 12/27/08 -- Returns describing worsen of breathing last week after ha ran out of both his Spiriva and Ventolin. He had paroxyms of cough and passed out on one occasion. Was using ventolin every 2 hours. Restarted Spiriva 2 days ago and now he is much improved.   ROV 01/10/09 -- Returns for scheduled follow up, has been doing very well. Taking the Spiriva + Symbicort. He feels much better on this regimen. Able to walk farther. Using rescue inhaler MUCH less than last time. Down to 2 cigarettes a day.   ROV 02/05/09 -- Seen back today, earlier than originally planned, after an asthma flare that sent him to the ED 12/3. Was experiencing several periods of wheezing a day, was using ventolin 6 times a day. He has been around a smoker every day, states he quit 2 weeks ago. He was sengt out on a quick prednisone taper. Much improved now.   ROV 04/09/09 -- returns feeling well, breathing has been stable since he was treated for an AE in early January (pred = azithro). May have also had thrush, treated with fluconazole. He had an episode of acute  dyspnea following exposure to bug spray about a week ago. No tobacco. Some cough with clear thick sputum. ROV 06/07/09 -- follow up for COPD. He tells me he was treated with prednisone for a flare in March. He is on Spiriva and Symbicort, but has run out of Spiriva for 3 days. He has to get his social services card renewed, planning to get this done today. He is feeling well now, good exertional tolerance. Quit smoking 2 weeks ago!!   Current Medications (verified): 1)  Ventolin Hfa 108 (90 Base) Mcg/act Aers (Albuterol Sulfate) .Marland Kitchen.. 1-2 Puffs Every 4-6 Hours As Needed 2)  Amlodipine Besylate 10 Mg Tabs (Amlodipine Besylate) .... Once Daily 3)  Hydrochlorothiazide 25 Mg Tabs (Hydrochlorothiazide) .... Once Daily 4)  Spiriva Handihaler 18 Mcg Caps (Tiotropium Bromide Monohydrate) .... Out of Medication 5)  Famotidine 20 Mg Tabs (Famotidine) .... Two Times A Day 6)  Symbicort 160-4.5 Mcg/act Aero (Budesonide-Formoterol Fumarate) .... 2 Puffs Two Times A Day 7)  Nasacort Aq 55 Mcg/act Aers (Triamcinolone Acetonide(Nasal)) .... 2 Sprays in Each Nostril Daily As Needed  Allergies (verified): No Known Drug Allergies  Vital Signs:  Patient profile:   60 year old male Height:      72 inches (182.88 cm) Weight:      178.38 pounds (81.08 kg) BMI:     24.28 O2 Sat:      95 % on Room air Temp:  97.9 degrees F (36.61 degrees C) oral Pulse rate:   86 / minute BP sitting:   112 / 74  (left arm) Cuff size:   regular  Vitals Entered By: [name] CMA (June 07, 2009 1:28 PM)  O2 Sat at Rest %:  95 O2 Flow:  Room air  Physical Exam  General:  normal appearance, healthy appearing, and thin.   Head:  normocephalic and atraumatic Eyes:  sclera injected Nose:  no deformity, discharge, inflammation, or lesions Mouth:  no deformity or lesions Neck:  no masses, thyromegaly, or abnormal cervical nodes Lungs:  distant, no wheezes Heart:  regular rate and rhythm, S1, S2 without murmurs, rubs, gallops,  or clicks Abdomen:  not examined Msk:  no deformity or scoliosis noted with normal posture Extremities:  no clubbing, cyanosis, edema, or deformity noted Neurologic:  non-focal Skin:  intact without lesions or rashes Psych:  alert and cooperative; normal mood and affect; normal attention span and concentration   Impression & Recommendations:  Problem # 1:  COPD (ICD-496) Needs to get back on his maintanance meds, will get them filled as soon as his social services card is renewed.  - Spiriva + Symbicort + ventolin - ROV in 3 months or as needed   Medications Added to Medication List This Visit: 1)  Spiriva Handihaler 18 Mcg Caps (Tiotropium bromide monohydrate) .... Out of medication  Other Orders: Est. Patient Level III (04540)  Patient Instructions: 1)  Get your Spiriva and Symbicort refilled as planned and restart these meds. 2)  CONGRATULATIONS on quitting smoking! Call our office if you  are thinking about restarting - we can help you! 3)  Follow up with Dr Delton Coombes in 3 months or as needed.

## 2010-05-02 ENCOUNTER — Ambulatory Visit (INDEPENDENT_AMBULATORY_CARE_PROVIDER_SITE_OTHER): Payer: Self-pay | Admitting: Emergency Medicine

## 2010-05-02 ENCOUNTER — Encounter: Payer: Self-pay | Admitting: Emergency Medicine

## 2010-05-02 DIAGNOSIS — J309 Allergic rhinitis, unspecified: Secondary | ICD-10-CM

## 2010-05-02 DIAGNOSIS — J449 Chronic obstructive pulmonary disease, unspecified: Secondary | ICD-10-CM

## 2010-05-02 DIAGNOSIS — K219 Gastro-esophageal reflux disease without esophagitis: Secondary | ICD-10-CM | POA: Insufficient documentation

## 2010-05-09 NOTE — Assessment & Plan Note (Signed)
Summary: COPD   Visit Type:  Follow-up Primary Provider/Referring Provider:  Fannie Knee Mccann, [facility]  CC:  COPD.  SOB w/ exertion is the same.  No new complaints today.Marland Kitchen  History of Present Illness: 57 hx current tobacco, COPD +/- asthma, HTN. Little other PMH. Has been maintained on Spiriva + Symbicort since 12/2008.   ROV 12/10/09 -- scheduled f/u COPD, dyspnea on exertional and at night. Tells me that he continues to have nocturnal awakenings. These episodes are not reliably relieved by BD. ? snoring, hx possible apneas. Napping daily. Using frequent Ventolin, 4 -5 x a day. Last time we started protonix, his GERD and belching are both better. States thaqt he stopped smoking after our last visit (July), but he does smell of smoke. 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 Holter monitoring performed but he doesn';t know the results yet.   February 18, 2010 --Preesnts for ER follow up. Went to ER 12.17.11, diagnosed w/ PNA.  Was given amoxicillin and doxycycline.  states feels better but still having some prod cough with yellow mucus,   Gets meds thru [facility]. Xray showed a probable RUL PNA. He is feeling better. Denies chest pain,  orthopnea, hemoptysis, fever, n/v/d, edema, headache.  ROV 02/28/10 -- f/u severe COPD/asthma, still smoking occas cigarette. Was treated for CAP 02/16/10. Returns today w f/u CXR, also to review PSG. He tells me that his cough is better, no more sputum. Has wheeze after walking a city block. He continues to have episodes of syncope, no clear etiology. His PSG shows AHI 3, RDI 10.5 (done 01/02/10).   ROV 05/02/10 -- severe COPD, responsive to BD. He describes significant improvement in his symptoms since he moved to new location, with his sister. No longer w second hand smoke, no pets or dust exposure. No exacerbations since last time. Cough is better. Using SABA about 4x a week. Has to stop after about 40 yrds walking. He has stopped smoking  altogether, has gained about 20 lbs.   Preventive Screening-Counseling & Management  Alcohol-Tobacco     Smoking Status: quit < 6 months     Year Quit: 2011  Current Medications (verified): 1)  Amlodipine Besylate 10 Mg Tabs (Amlodipine Besylate) .... Once Daily 2)  Hydrochlorothiazide 25 Mg Tabs (Hydrochlorothiazide) .... Once Daily 3)  Spiriva Handihaler 18 Mcg Caps (Tiotropium Bromide Monohydrate) .... Inhale Contents of 1 Capsule Once A Day 4)  Symbicort 160-4.5 Mcg/act Aero (Budesonide-Formoterol Fumarate) .... 2 Puffs Two Times A Day 5)  Nasacort Aq 55 Mcg/act Aers (Triamcinolone Acetonide(Nasal)) .... 2 Sprays in Each Nostril Daily As Needed 6)  Protonix 40 Mg Tbec (Pantoprazole Sodium) .Marland Kitchen.. 1 By Mouth Daily 7)  Ventolin Hfa 108 (90 Base) Mcg/act Aers (Albuterol Sulfate) .Marland Kitchen.. 1-2 Puffs Every 4-6 Hours As Needed  Allergies (verified): No Known Drug Allergies  Social History: Smoking Status:  quit < 6 months  Vital Signs:  Patient profile:   61 year old male Height:      72 inches (182.88 cm) Weight:      194.38 pounds (88.35 kg) BMI:     26.46 O2 Sat:      93 % on Room air Temp:     97.6 degrees F (36.44 degrees C) oral Pulse rate:   80 / minute BP sitting:   112 / 80  (left arm) Cuff size:   regular  Vitals Entered By: Alex Mccann CMA (May 02, 2010 1:15 PM)  O2 Sat at Rest %:  93 O2 Flow:  Room air CC: COPD.  SOB w/ exertion is the same.  No new complaints today. Comments Medications reviewed with patient Alex Mccann CMA  May 02, 2010 1:28 PM   Physical Exam  General:  normal appearance, healthy appearing, has gained wt since last time Head:  normocephalic and atraumatic Eyes:  sclera injected Nose:  no deformity, discharge, inflammation, or lesions Mouth:  no deformity or lesions Neck:  no masses, thyromegaly, or abnormal cervical nodes Lungs:  distant, no wheezes today. Hyper-resonant Heart:  regular rate and rhythm, S1, S2 without murmurs, rubs,  gallops, or clicks Abdomen:  not examined Msk:  no deformity or scoliosis noted with normal posture Extremities:  no clubbing, cyanosis, edema, or deformity noted Neurologic:  non-focal Skin:  intact without lesions or rashes Psych:  alert and cooperative; normal mood and affect; normal attention span and concentration   Impression & Recommendations:  Problem # 1:  COPD (ICD-496) - Spiriva and Symbicort - as needed SABA - rov 4 months  Problem # 2:  ALLERGIC RHINITIS (ICD-477.9)  - nasacort  Orders: Est. Patient Level IV (04540)  Problem # 3:  GERD (ICD-530.81) - protonix  Problem # 4:  HYPERTENSION (ICD-401.9)  His updated medication list for this problem includes:    Amlodipine Besylate 10 Mg Tabs (Amlodipine besylate) ..... Once daily    Hydrochlorothiazide 25 Mg Tabs (Hydrochlorothiazide) ..... Once daily  Patient Instructions: 1)  Please continue your Spiriva and Symbicort as you are taking them  2)  Use your rescue inhaler as needed  3)  Continue your nasacort once daily, and your protonix once daily  4)  Follow up with Dr Delton Coombes in 4 months or as needed

## 2010-05-13 LAB — BASIC METABOLIC PANEL
BUN: 14 mg/dL (ref 6–23)
Calcium: 9.1 mg/dL (ref 8.4–10.5)
Creatinine, Ser: 1.34 mg/dL (ref 0.4–1.5)
GFR calc non Af Amer: 55 mL/min — ABNORMAL LOW (ref >60)
Glucose, Bld: 115 mg/dL — ABNORMAL HIGH (ref 70–99)
Potassium: 3.2 mEq/L — ABNORMAL LOW (ref 3.5–5.1)

## 2010-05-13 LAB — CBC
Hemoglobin: 14 g/dL (ref 13.0–17.0)
MCH: 31.6 pg (ref 26.0–34.0)
MCHC: 34.6 g/dL (ref 30.0–36.0)

## 2010-05-13 LAB — DIFFERENTIAL
Basophils Absolute: 0 10*3/uL (ref 0.0–0.1)
Eosinophils Relative: 2 % (ref 0–5)
Lymphs Abs: 3.5 10*3/uL (ref 0.7–4.0)
Monocytes Relative: 8 % (ref 3–12)
Neutro Abs: 7.4 10*3/uL (ref 1.7–7.7)
Neutrophils Relative %: 61 % (ref 43–77)

## 2010-05-13 LAB — URINALYSIS, ROUTINE W REFLEX MICROSCOPIC
Bilirubin Urine: NEGATIVE
Glucose, UA: NEGATIVE mg/dL
Hgb urine dipstick: NEGATIVE
Ketones, ur: NEGATIVE mg/dL
Nitrite: NEGATIVE
Protein, ur: NEGATIVE mg/dL
pH: 6 (ref 5.0–8.0)

## 2010-05-13 LAB — POCT CARDIAC MARKERS: Myoglobin, poc: 236 ng/mL (ref 12–200)

## 2010-05-15 ENCOUNTER — Telehealth (INDEPENDENT_AMBULATORY_CARE_PROVIDER_SITE_OTHER): Payer: Self-pay | Admitting: *Deleted

## 2010-05-15 LAB — CBC
HCT: 45.2 % (ref 39.0–52.0)
Hemoglobin: 15.1 g/dL (ref 13.0–17.0)
MCH: 31 pg (ref 26.0–34.0)
MCHC: 33.4 g/dL (ref 30.0–36.0)
MCV: 92.8 fL (ref 78.0–100.0)
Platelets: 270 10*3/uL (ref 150–400)
RBC: 4.87 MIL/uL (ref 4.22–5.81)
RDW: 12.7 % (ref 11.5–15.5)
WBC: 11 10*3/uL — ABNORMAL HIGH (ref 4.0–10.5)

## 2010-05-15 LAB — DIFFERENTIAL
Eosinophils Absolute: 0.2 10*3/uL (ref 0.0–0.7)
Eosinophils Relative: 2 % (ref 0–5)
Lymphocytes Relative: 30 % (ref 12–46)
Monocytes Relative: 9 % (ref 3–12)
Neutro Abs: 6.4 10*3/uL (ref 1.7–7.7)

## 2010-05-15 LAB — POCT I-STAT, CHEM 8
BUN: 21 mg/dL (ref 6–23)
Calcium, Ion: 1.18 mmol/L (ref 1.12–1.32)
Chloride: 101 mEq/L (ref 96–112)
Creatinine, Ser: 1.3 mg/dL (ref 0.4–1.5)
Glucose, Bld: 94 mg/dL (ref 70–99)
HCT: 49 % (ref 39.0–52.0)
Hemoglobin: 16.7 g/dL (ref 13.0–17.0)
Potassium: 4 mEq/L (ref 3.5–5.1)
Sodium: 140 mEq/L (ref 135–145)
TCO2: 31 mmol/L (ref 0–100)

## 2010-05-15 LAB — POCT CARDIAC MARKERS
CKMB, poc: 1.2 ng/mL (ref 1.0–8.0)
Myoglobin, poc: 80.7 ng/mL (ref 12–200)
Troponin i, poc: <0.05 ng/mL (ref 0.00–0.09)

## 2010-05-15 LAB — URINALYSIS, ROUTINE W REFLEX MICROSCOPIC
Glucose, UA: NEGATIVE mg/dL
Hgb urine dipstick: NEGATIVE
Ketones, ur: NEGATIVE mg/dL
pH: 6 (ref 5.0–8.0)

## 2010-05-21 NOTE — Progress Notes (Signed)
  Phone Note Other Incoming   Request: Send information Summary of Call: Request for records received from [facility]. Request forwarded to [facility].  04/10/2009 to present

## 2010-06-07 LAB — CBC
HCT: 36.5 % — ABNORMAL LOW (ref 39.0–52.0)
HCT: 41.1 % (ref 39.0–52.0)
Hemoglobin: 14 g/dL (ref 13.0–17.0)
MCHC: 33.5 g/dL (ref 30.0–36.0)
MCHC: 34 g/dL (ref 30.0–36.0)
MCV: 97.3 fL (ref 78.0–100.0)
MCV: 97.6 fL (ref 78.0–100.0)
Platelets: 207 10*3/uL (ref 150–400)
RBC: 3.7 MIL/uL — ABNORMAL LOW (ref 4.22–5.81)
RBC: 4.23 MIL/uL (ref 4.22–5.81)

## 2010-06-07 LAB — BASIC METABOLIC PANEL
BUN: 14 mg/dL (ref 6–23)
BUN: 14 mg/dL (ref 6–23)
CO2: 28 mEq/L (ref 19–32)
CO2: 28 mEq/L (ref 19–32)
CO2: 31 mEq/L (ref 19–32)
CO2: 31 mEq/L (ref 19–32)
Calcium: 8.2 mg/dL — ABNORMAL LOW (ref 8.4–10.5)
Calcium: 9.1 mg/dL (ref 8.4–10.5)
Calcium: 9.3 mg/dL (ref 8.4–10.5)
Chloride: 102 mEq/L (ref 96–112)
Chloride: 105 mEq/L (ref 96–112)
Chloride: 96 mEq/L (ref 96–112)
Creatinine, Ser: 0.95 mg/dL (ref 0.4–1.5)
Creatinine, Ser: 1.09 mg/dL (ref 0.4–1.5)
GFR calc Af Amer: >60 mL/min (ref >60)
Glucose, Bld: 108 mg/dL — ABNORMAL HIGH (ref 70–99)
Glucose, Bld: 151 mg/dL — ABNORMAL HIGH (ref 70–99)
Potassium: 3.2 mEq/L — ABNORMAL LOW (ref 3.5–5.1)
Potassium: 3.5 mEq/L (ref 3.5–5.1)
Potassium: 4.7 mEq/L (ref 3.5–5.1)
Sodium: 133 mEq/L — ABNORMAL LOW (ref 135–145)
Sodium: 138 mEq/L (ref 135–145)

## 2010-06-07 LAB — BLOOD GAS, ARTERIAL
Bicarbonate: 27.2 mEq/L — ABNORMAL HIGH (ref 20.0–24.0)
MECHVT: 0.6 mL
O2 Saturation: 93.1 %
PEEP: 0.5 cmH2O
Patient temperature: 98.6
pH, Arterial: 7.319 — ABNORMAL LOW (ref 7.350–7.450)

## 2010-06-07 LAB — DIFFERENTIAL
Basophils Relative: 0 % (ref 0–1)
Monocytes Absolute: 1.1 10*3/uL — ABNORMAL HIGH (ref 0.1–1.0)
Monocytes Relative: 8 % (ref 3–12)
Neutro Abs: 9 10*3/uL — ABNORMAL HIGH (ref 1.7–7.7)

## 2010-06-07 LAB — GLUCOSE, CAPILLARY
Glucose-Capillary: 118 mg/dL — ABNORMAL HIGH (ref 70–99)
Glucose-Capillary: 123 mg/dL — ABNORMAL HIGH (ref 70–99)
Glucose-Capillary: 128 mg/dL — ABNORMAL HIGH (ref 70–99)
Glucose-Capillary: 147 mg/dL — ABNORMAL HIGH (ref 70–99)
Glucose-Capillary: 148 mg/dL — ABNORMAL HIGH (ref 70–99)
Glucose-Capillary: 148 mg/dL — ABNORMAL HIGH (ref 70–99)

## 2010-06-07 LAB — MAGNESIUM: Magnesium: 2.3 mg/dL (ref 1.5–2.5)

## 2010-06-08 LAB — BASIC METABOLIC PANEL
BUN: 10 mg/dL (ref 6–23)
BUN: 10 mg/dL (ref 6–23)
BUN: 8 mg/dL (ref 6–23)
CO2: 24 mEq/L (ref 19–32)
CO2: 29 mEq/L (ref 19–32)
Calcium: 8.7 mg/dL (ref 8.4–10.5)
Chloride: 103 mEq/L (ref 96–112)
Chloride: 103 mEq/L (ref 96–112)
Chloride: 103 mEq/L (ref 96–112)
Creatinine, Ser: 0.96 mg/dL (ref 0.4–1.5)
Glucose, Bld: 150 mg/dL — ABNORMAL HIGH (ref 70–99)
Glucose, Bld: 193 mg/dL — ABNORMAL HIGH (ref 70–99)
Potassium: 4 mEq/L (ref 3.5–5.1)
Potassium: 4.6 mEq/L (ref 3.5–5.1)
Sodium: 137 mEq/L (ref 135–145)

## 2010-06-08 LAB — PHOSPHORUS: Phosphorus: 5 mg/dL — ABNORMAL HIGH (ref 2.3–4.6)

## 2010-06-08 LAB — BLOOD GAS, ARTERIAL
Acid-Base Excess: 0.3 mmol/L (ref 0.0–2.0)
Acid-Base Excess: 1.3 mmol/L (ref 0.0–2.0)
Bicarbonate: 24.5 mEq/L — ABNORMAL HIGH (ref 20.0–24.0)
Bicarbonate: 25.5 mEq/L — ABNORMAL HIGH (ref 20.0–24.0)
FIO2: 0.4 %
FIO2: 0.4 %
MECHVT: 600 mL
MECHVT: 600 mL
MECHVT: 600 mL
O2 Saturation: 92.1 %
RATE: 12 resp/min
TCO2: 26 mmol/L (ref 0–100)
TCO2: 27.1 mmol/L (ref 0–100)
TCO2: 28.9 mmol/L (ref 0–100)
pCO2 arterial: 50.3 mmHg — ABNORMAL HIGH (ref 35.0–45.0)
pCO2 arterial: 51.8 mmHg — ABNORMAL HIGH (ref 35.0–45.0)
pH, Arterial: 7.295 — ABNORMAL LOW (ref 7.350–7.450)
pH, Arterial: 7.326 — ABNORMAL LOW (ref 7.350–7.450)
pO2, Arterial: 73.2 mmHg — ABNORMAL LOW (ref 80.0–100.0)

## 2010-06-08 LAB — CBC
HCT: 37.4 % — ABNORMAL LOW (ref 39.0–52.0)
HCT: 45.6 % (ref 39.0–52.0)
Hemoglobin: 15.6 g/dL (ref 13.0–17.0)
MCHC: 32.9 g/dL (ref 30.0–36.0)
MCHC: 34.2 g/dL (ref 30.0–36.0)
MCV: 96.8 fL (ref 78.0–100.0)
MCV: 97.8 fL (ref 78.0–100.0)
Platelets: 197 10*3/uL (ref 150–400)
Platelets: 234 10*3/uL (ref 150–400)
RBC: 4.66 MIL/uL (ref 4.22–5.81)
RDW: 13 % (ref 11.5–15.5)
RDW: 13.1 % (ref 11.5–15.5)
WBC: 18.5 10*3/uL — ABNORMAL HIGH (ref 4.0–10.5)
WBC: 9.9 10*3/uL (ref 4.0–10.5)

## 2010-06-08 LAB — POCT I-STAT 3, ART BLOOD GAS (G3+)
TCO2: 28 mmol/L (ref 0–100)
pCO2 arterial: 59.7 mmHg (ref 35.0–45.0)
pH, Arterial: 7.258 — ABNORMAL LOW (ref 7.350–7.450)
pO2, Arterial: 222 mmHg — ABNORMAL HIGH (ref 80.0–100.0)

## 2010-06-08 LAB — GLUCOSE, CAPILLARY
Glucose-Capillary: 103 mg/dL — ABNORMAL HIGH (ref 70–99)
Glucose-Capillary: 128 mg/dL — ABNORMAL HIGH (ref 70–99)
Glucose-Capillary: 147 mg/dL — ABNORMAL HIGH (ref 70–99)
Glucose-Capillary: 150 mg/dL — ABNORMAL HIGH (ref 70–99)
Glucose-Capillary: 153 mg/dL — ABNORMAL HIGH (ref 70–99)
Glucose-Capillary: 173 mg/dL — ABNORMAL HIGH (ref 70–99)

## 2010-06-08 LAB — DIFFERENTIAL
Basophils Relative: 0 % (ref 0–1)
Eosinophils Absolute: 0.1 10*3/uL (ref 0.0–0.7)
Neutrophils Relative %: 95 % — ABNORMAL HIGH (ref 43–77)

## 2010-06-08 LAB — CULTURE, RESPIRATORY W GRAM STAIN

## 2010-06-08 LAB — URINALYSIS, MICROSCOPIC ONLY
Glucose, UA: 100 mg/dL — AB
Hgb urine dipstick: NEGATIVE
Protein, ur: 30 mg/dL — AB
Specific Gravity, Urine: 1.018 (ref 1.005–1.030)
pH: 5.5 (ref 5.0–8.0)

## 2010-06-08 LAB — CULTURE, BLOOD (ROUTINE X 2): Culture: NO GROWTH

## 2010-06-08 LAB — POCT I-STAT, CHEM 8
HCT: 51 % (ref 39.0–52.0)
Hemoglobin: 17.3 g/dL — ABNORMAL HIGH (ref 13.0–17.0)
Sodium: 138 mEq/L (ref 135–145)
TCO2: 30 mmol/L (ref 0–100)

## 2010-06-08 LAB — CARDIAC PANEL(CRET KIN+CKTOT+MB+TROPI)
CK, MB: 2.5 ng/mL (ref 0.3–4.0)
CK, MB: 3.1 ng/mL (ref 0.3–4.0)
Relative Index: 1.6 (ref 0.0–2.5)
Relative Index: 1.6 (ref 0.0–2.5)
Relative Index: 2.1 (ref 0.0–2.5)
Total CK: 195 U/L (ref 7–232)
Troponin I: 0.02 ng/mL (ref 0.00–0.06)

## 2010-06-08 LAB — POCT CARDIAC MARKERS: Myoglobin, poc: 110 ng/mL (ref 12–200)

## 2010-06-08 LAB — MAGNESIUM: Magnesium: 2.1 mg/dL (ref 1.5–2.5)

## 2010-06-08 LAB — URINE CULTURE: Colony Count: NO GROWTH

## 2010-07-09 ENCOUNTER — Encounter: Payer: Self-pay | Admitting: Cardiovascular Disease

## 2010-07-16 NOTE — Procedures (Signed)
CAROTID DUPLEX EXAM   INDICATION:  Syncope.   HISTORY:  Diabetes:  No.  Cardiac:  No.  Hypertension:  Yes.  Smoking:  Previous.  Previous Surgery:  No.  CV History:  Multiple episodes of passing out.  Amaurosis Fugax No, Paresthesias No, Hemiparesis No                                       RIGHT             LEFT  Brachial systolic pressure:         142               126  Brachial Doppler waveforms:         Normal            Normal  Vertebral direction of flow:        Antegrade         Antegrade  DUPLEX VELOCITIES (cm/sec)  CCA peak systolic                   67                89  ECA peak systolic                   72                60  ICA peak systolic                   165               60  ICA end diastolic                   64                23  PLAQUE MORPHOLOGY:                                    Heterogeneous  PLAQUE AMOUNT:                      None              Minimal  PLAQUE LOCATION:                                      ICA   IMPRESSION:  1. Doppler velocity suggests a 40%-59% stenosis of the right proximal      internal carotid artery, however, no plaque formation was      adequately visualized.  This increase in velocity may be due to a      change in vessel diameter.  2. No hemodynamically significant stenosis of the left internal      carotid artery noted with minimal plaque formation visualized.   ___________________________________________  Janetta Hora Fields, MD   CH/MEDQ  D:  10/19/2009  T:  10/19/2009  Job:  [id number]

## 2010-07-16 NOTE — Assessment & Plan Note (Signed)
OFFICE VISIT   Garver, Alex Mccann  DOB:  August 09, 1950                                       10/18/2009  OZDGU#:[id number]   CHIEF COMPLAINT:  Syncope.   HISTORY OF PRESENT ILLNESS:  The patient is a 60 year old male referred  from [facility] for evaluation of syncope and possible carotid disease.  The patient had a syncopal episode on three separate occasions 5-6 weeks  ago.  All the records for review and workup of that are not available  today.  However, he did have a head CT which showed no evidence of  stroke or cerebral hemorrhage.  He denies any significant cardiac workup  other than electrocardiogram.  He did not have an echocardiogram.  He  did not have an event monitor placed.  He denies any prior history of  TIA, amaurosis or stroke.  He denies any prior history of myocardial  infarction.  He is a former tobacco abuser but quit smoking 5 months  ago.   Chronic medical problems include COPD, hypertension and reflux disease.  He also has a previous history of peptic ulcer disease.   Past surgical history is remarkable for repair of a gastric ulcer in  2007.   SOCIAL HISTORY:  He is currently unemployed.  He has one child.  He is  single.  Smoking history is as listed above.  He denies use of drugs or  alcohol.   FAMILY HISTORY:  Unremarkable.   REVIEW OF SYSTEMS:  Full 12 point review of systems was performed with  the patient today, please see intake referral form for details regarding  this.   PHYSICAL EXAM:  Vital signs:  Blood pressure is 132/84 in the right arm,  134/86 in the left arm, oxygen saturation is 98% on room air, heart rate  is 50 and regular.  HEENT:  Unremarkable.  Neck:  Has 2+ carotid pulses  without bruit.  Chest:  Clear to auscultation.  Cardiac:  Exam is  regular rate and rhythm without murmur.  Abdomen:  Soft, nontender,  nondistended with a well-healed upper midline scar.  Extremities:  He  has no significant edema.  He  has no joint deformities.  Neurologic:  Exam shows symmetric upper extremity and lower extremity motor strength  which is 5/5 and symmetric.  Vascular:  Exam shows 2+ brachial and  radial pulse in the left arm, 2+ right brachial with absent right radial  pulse.  He has 2+ femoral pulses and a 2+ dorsalis pedis pulse on the  right, 1+ dorsalis pedis pulse on the left.   He had a carotid duplex exam today which shows a 40%-60% right internal  carotid artery stenosis.  However, this is probably more toward the 40%  range as there was some vessel tortuosity.  He has no left internal  carotid artery stenosis.  I reviewed his head CT which showed no  evidence of stroke or bleed.   In summary, the patient has a moderate carotid stenosis.  I do not  believe that this is the cause of his syncopal episode.  Usually this  takes high-grade four vessel disease to have syncope related to carotid  disease.  I believe that further cardiology workup with either an event  monitor or Holter monitor might be the best course of action next to  further evaluate his syncope.  I will leave this up to the discretion of  his [facility] physicians.  We will see him back in followup in 1 year  for repeat carotid duplex exam.  We will consider at that time whether  or not he needs further followup.  I discussed with the patient today  the possibility of placing him on aspirin but due to his previous peptic  ulcer disease I believe that the risk is not currently worth this since  probably he did not have an embolic event related to carotid disease.     Janetta Hora. Fields, MD  Electronically Signed   CEF/MEDQ  D:  10/18/2009  T:  10/19/2009  Job:  [id number]   cc:   Melvern Banker

## 2010-07-19 NOTE — Discharge Summary (Signed)
NAME:  Alex Mccann, Alex Mccann NO.:  [id number]   MEDICAL RECORD NO.:  [id number]          PATIENT TYPE:  INP   LOCATION:  5733                         FACILITY:  [facility]   PHYSICIAN:  Lebron Conners, M.D.   DATE OF BIRTH:  22-Aug-1950   DATE OF ADMISSION:  05/18/2004  DATE OF DISCHARGE:  05/24/2004                                 DISCHARGE SUMMARY   DISCHARGE DIAGNOSES:  1.  Perforated duodenal ulcer status post patch oversew by Dr. Ovidio Kin      on May 18, 2004.  2.  Tobacco abuse.   HOSPITAL COURSE:  Mr. Philipp is a 60 year old male patient who has had  intermittent abdominal pain for approximately five years.  Over the past  month he has had increasing pain, and on the day of admission he had acute  onset of abdominal pain.  He does admit to taking occasional BC powders.  CT  scan was performed and free air was revealed with significant amount of free  fluid.  The patient was taken to the operating room and found to have a  perforated duodenal ulcer.  Underwent a Lucillie Garfinkel omental patch/oversew  under the care of Dr. Ovidio Kin.   Over the next several days, the patient's diet was increased.  His NG tube  was removed and his diet eventually progressed to a regular diet.  He was  ambulating without difficulty, and on May 24, 2004 was ready for discharge  to home.   Please see complete chart for more specific details.   The patient's discharge medications include:  1.  Pepcid 20 mg p.o. b.i.d.  2.  Vicodin 5/500 1-2 tablets every 6 hours as needed for pain.  3.  Phenergan 25 mg 1 1/2 tablet every four hours as needed for nausea.   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  No lifting over 10 pounds.  He is to remain on a low fat diet.  Clean over area gently with soap and water.  Call for questions or concerns  at [phone].  He has a followup appointment with Dr. Ovidio Kin on June 21, 2004 at 9  a.m.      LB/MEDQ  D:  05/24/2004  T:  05/24/2004  Job:  [id number]

## 2010-07-19 NOTE — H&P (Signed)
NAME:  Alex Mccann, Alex Mccann NO.:  [id number]   MEDICAL RECORD NO.:  [id number]          PATIENT TYPE:  EMS   LOCATION:  MAJO                         FACILITY:  [facility]   PHYSICIAN:  Sandria Bales. Ezzard Standing, M.D.  DATE OF BIRTH:  Apr 17, 1950   DATE OF ADMISSION:  05/18/2004  DATE OF DISCHARGE:                                HISTORY & PHYSICAL   HISTORY OF ILLNESS:  This is a 60 year old black male who has no primary  medical doctor, who has had on and off abdominal pain for some 5 years.  He  has never had an endoscopy, never had any prior abdominal surgery.  He has  never had any liver disease, pancreatic disease, or colon disease.  He  apparently drank heavily until about 2 years ago, but has cut back  significantly on EtOH intake.   About last month, he thinks had some increasing pain.  He takes occasional  BC powders, but does not feel like he is taking them on a daily basis.  He  does smoke two packs of cigarettes a day.  Today, he had the acute onset of  abdominal pain.  He called the ambulance, who brought him to the [facility] ER.   PAST MEDICAL HISTORY:   ALLERGIES:  He has no allergies.   MEDICATIONS:  He is on no medications.   REVIEW OF SYSTEMS:  NEUROLOGIC:  No history of seizures or loss of  consciousness.  PULMONARY:  He worked for a Chiropractor about 5 years ago.  He said  he developed some asthma during that time, and was on inhalers.  When he  quit working at [employer], his asthma go better, though he has  continued to smoke cigarettes, and he knows these are bad for his health.  CARDIAC:  He thinks he may have had a heart attack 6-7 years ago, but he is  unsure of this.  He stated he was brought to the hospital after he passed  out, but does not see a cardiologist, and is on no kind of cardiac  treatment.  GASTROINTESTINAL:  See history of present illness.  UROLOGIC:  No history of kidney stones or kidney infections.   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   PHYSICAL EXAMINATION:  VITAL SIGNS:  Temperature is 96, blood pressure  106/67, pulse 62, respirations 22.  GENERAL:  He is a well-nourished black male who is alert, cooperative, and  oriented.  HEENT:  Unremarkable, though he has very few teeth.  NECK:  Supple.  I feel no mass or thyromegaly.  LUNGS:  Clear to auscultation, though he cannot take a deep breath, and he  does have a rattlely cough.  I think, in part, this is due to his abdominal  pain.  HEART:  Regular rate and rhythm.  ABDOMEN:  Board-like and rigid with guarding and absent bowel sounds.  I  feel no abdominal mass.  He has no abdominal scars.  EXTREMITIES:  He has good strength in the upper  and lower extremities.  NEUROLOGIC:  Grossly intact to motor and sensory function.   LABORATORY DATA:  White blood count of 13,100, hemoglobin of 14, hematocrit  41.  Sodium was 138, potassium 3.3, chloride 105, CO2 of 27, glucose of 163,  BUN of 16, creatinine of 1.1.  His albumin was 3.6.  He had a CT scan that I reviewed with Dr. Signa Kell, and he has free air  on his CT scan, and a significant amount of free fluid.  There is no intra-  abdominal mass or organomegaly.   DIAGNOSES:  1.  Probable perforated ulcer with pneumoperitoneum.  To a less chance, he      could have some kind of perforated malignancy or colon perforation.  I discussed with him the need for going to surgery that needs to be done  now.  I discussed the potential risks which include infection, bleeding,  unable to find the perforation, and the need for possible colostomy.  He  lives with a male friend, who is not here; he has gone back home.  He is  divorced from his wife.  They have a single daughter who is a Consulting civil engineer, I  think he said at [employer] [place].  He is going to try to call I think his  mother-in-law so she notify his family that he is here at the hospital.  1.   Smokes heavily.  He knows he needs to cut back, if not quit, on this.  2.  He has questionable old myocardial infarction by history, though no      current medications for his heart.  3.  Remote history of heavy alcohol use.      DHN/MEDQ  D:  05/18/2004  T:  05/18/2004  Job:  [id number]

## 2010-07-19 NOTE — Op Note (Signed)
NAME:  Alex Mccann, Alex Mccann NO.:  [id number]   MEDICAL RECORD NO.:  [id number]          PATIENT TYPE:  INP   LOCATION:  1829                         FACILITY:  [facility]   PHYSICIAN:  Sandria Bales. Ezzard Standing, M.D.  DATE OF BIRTH:  24-Oct-1950   DATE OF PROCEDURE:  05/18/2004  DATE OF DISCHARGE:                                 OPERATIVE REPORT   PREOPERATIVE DIAGNOSIS:  Pneumoperitoneum, probable duodenal ulcer  perforation.   POSTOPERATIVE DIAGNOSIS:  Perforation of his duodenal ulcer at the duodenal  bulb.   PROCEDURE:  Roscoe-Graham omental patch/oversew of duodenal ulcer.   SURGEON:  Sandria Bales. Ezzard Standing, MD.   FIRST ASSISTANT:  None.   ANESTHESIA:  General endotracheal.   ESTIMATED BLOOD LOSS:  Less than 50 mL.   DRAINS LEFT IN:  None.   INDICATION FOR PROCEDURE:  Alex Mccann is a 61 year old black male, who has  presented with the acute onset of abdominal pain.  He has had vague  abdominal pain on and off for five years, which has gotten worse over the  last month, and then today developed sudden abdominal pain and was brought  to the emergency room.  A CT scan showed free air and intraabdominal fluid  in the abdomen, and his signs and symptoms are consistent with probable a  perforated ulcer.   The indications and potential complications of exploratory laparotomy were  explained to the patient.  The potential complications include, but are not  limited to, bleeding, infection, the need for possible bowel resection or  ostomy.   OPERATIVE NOTE:  The patient placed in a supine position and underwent a  general endotracheal anesthetic as supervised by Dr. Kipp Brood.  He was  given 1 gm of cefoxitin at the initiation of the procedure and had a Foley  catheter in place.  The abdomen was shaved, prepped with Betadine solution,  and sterilely draped.   I made an upper midline abdominal incision and with sharp dissection carried  down into the abdominal cavity and found  free air and a large amount of bile-  stained fluid in his abdominal cavity.  I aspirated this fluid out and got  about maybe 800 mL to a liter of fluid out.  I found a perforation  immediately beyond his pylorus, probably about 3 cm beyond the pylorus at  the duodenal bulb, and this hole was about 5 to 6 mm in size.  I oversewed  this perforation  with an interrupted 2-0 silk suture.  I then used some  omentum of the transverse colon and laid this over the perforation as both  an oversew and then a Roscoe-Graham omental patch.   I then irrigated the abdomen out with six liters of saline until the saline  was clear.  Abdominal exploration, otherwise, was that the right and left  lobes of the liver were unremarkable.  His stomach had an NG tube in place  and was otherwise unremarkable.  His transverse colon had stool in it.  I  did not try to eviscerate him, but palpated no other mass or  lesion within  his abdominal cavity.   I then closed his abdomen with two running #1 PDS sutures, I closed the skin  with a skin gun, and sterilely dressed with sterile gauze.   He tolerated the procedure well.  Sponge and needle counts were correct at  the end of the case.  Because of his perforation and dehydration, I will put  him in the intensive care unit for overnight observation.      DHN/MEDQ  D:  05/18/2004  T:  05/19/2004  Job:  [id number]

## 2010-07-23 ENCOUNTER — Telehealth: Payer: Self-pay | Admitting: *Deleted

## 2010-07-23 NOTE — Telephone Encounter (Signed)
Pt needs an appointment with Dr. Delton Coombes so he can complete forms regarding disability. LMOMTCB x 1

## 2010-07-25 ENCOUNTER — Encounter: Payer: Self-pay | Admitting: Cardiovascular Disease

## 2010-07-25 ENCOUNTER — Ambulatory Visit: Payer: Self-pay | Admitting: Cardiovascular Disease

## 2010-07-31 ENCOUNTER — Encounter: Payer: Self-pay | Admitting: *Deleted

## 2010-08-01 ENCOUNTER — Encounter: Payer: Self-pay | Admitting: Cardiovascular Disease

## 2010-08-01 ENCOUNTER — Ambulatory Visit (INDEPENDENT_AMBULATORY_CARE_PROVIDER_SITE_OTHER): Payer: Self-pay | Admitting: Cardiovascular Disease

## 2010-08-01 DIAGNOSIS — I10 Essential (primary) hypertension: Secondary | ICD-10-CM

## 2010-08-01 DIAGNOSIS — R0989 Other specified symptoms and signs involving the circulatory and respiratory systems: Secondary | ICD-10-CM

## 2010-08-01 DIAGNOSIS — I779 Disorder of arteries and arterioles, unspecified: Secondary | ICD-10-CM | POA: Insufficient documentation

## 2010-08-01 DIAGNOSIS — R55 Syncope and collapse: Secondary | ICD-10-CM

## 2010-08-01 DIAGNOSIS — J4489 Other specified chronic obstructive pulmonary disease: Secondary | ICD-10-CM

## 2010-08-01 DIAGNOSIS — J449 Chronic obstructive pulmonary disease, unspecified: Secondary | ICD-10-CM

## 2010-08-01 DIAGNOSIS — R079 Chest pain, unspecified: Secondary | ICD-10-CM | POA: Insufficient documentation

## 2010-08-01 NOTE — Assessment & Plan Note (Signed)
Well controlled.  Continue current medications and low sodium Dash type diet.    

## 2010-08-01 NOTE — Assessment & Plan Note (Signed)
No bruit on exam.  F/U duplex in 6 months.  ASA

## 2010-08-01 NOTE — Progress Notes (Signed)
60 yo previous smoker referred by [facility] for ? Syncope.  Review records in echart with normal echo 12/11.  Quit smoking last year.  Episodes related to heat.  Hasn't hurt himself.  ? Passes out for 60 seconds.  3-4 episodes last year.  Denies ETOH or drugs.  No associated palpiations.  Mild chronic dyspnea with exertion from COPD.  Sees Dr Delton Coombes.  Has gained 60 lbs over the last 4 years.  Does not wear CPAP.  Has some SSCP.  Not always exertional.  Cant tell if it is gas.  Does not last but a minute.  Has known 40-59% RICA disease from duplex last year.  CRF HTN and elevated lipids.  No history of CHF, arrythmia, or PE.    ROS: Denies fever, malais, weight loss, blurry vision, decreased visual acuity, cough, sputum, SOB, hemoptysis, pleuritic pain, palpitaitons, heartburn, abdominal pain, melena, lower extremity edema, claudication, or rash.   General: Affect appropriate Healthy:  appears stated age HEENT: normal Neck supple with no adenopathy JVP normal no bruits no thyromegaly Lungs clear with no wheezing and good diaphragmatic motion Heart:  S1/S2 no murmur,rub, gallop or click PMI normal Abdomen: benighn, BS positve, no tenderness, no AAA no bruit.  No HSM or HJR Distal pulses intact with no bruits No edema Neuro non-focal Skin warm and dry No muscular weakness  Medications Current Outpatient Prescriptions  Medication Sig Dispense Refill  . albuterol (VENTOLIN HFA) 108 (90 BASE) MCG/ACT inhaler Inhale 2 puffs into the lungs every 6 (six) hours as needed.        Marland Kitchen amLODipine (NORVASC) 10 MG tablet Take 10 mg by mouth daily.        . budesonide-formoterol (SYMBICORT) 160-4.5 MCG/ACT inhaler Inhale 2 puffs into the lungs 2 (two) times daily.        Marland Kitchen lisinopril (PRINIVIL,ZESTRIL) 40 MG tablet Take 40 mg by mouth daily.        . pantoprazole (PROTONIX) 40 MG tablet Take 40 mg by mouth daily.        . pravastatin (PRAVACHOL) 40 MG tablet Take 40 mg by mouth daily.        Marland Kitchen  tiotropium (SPIRIVA) 18 MCG inhalation capsule Place 18 mcg into inhaler and inhale daily.        Marland Kitchen triamcinolone (NASACORT) 55 MCG/ACT nasal inhaler 2 sprays by Nasal route daily.        Marland Kitchen DISCONTD: hydrochlorothiazide 25 MG tablet Take 25 mg by mouth daily.          Allergies Review of patient's allergies indicates no known allergies.  Family History: Family History  Problem Relation Age of Onset  . Allergies Sister   . Heart disease Brother     Social History: History   Social History  . Marital Status: Divorced    Spouse Name: N/A    Number of Children: 1  . Years of Education: N/A   Occupational History  . Not on file.   Social History Main Topics  . Smoking status: Former Smoker    Quit date: 02/01/1911  . Smokeless tobacco: Not on file  . Alcohol Use: Not on file  . Drug Use: Not on file  . Sexually Active: Not on file   Other Topics Concern  . Not on file   Social History Narrative  . No narrative on file    Electrocardiogram:  NSR RBBB rate 59  Assessment and Plan

## 2010-08-01 NOTE — Patient Instructions (Signed)
Your physician recommends that you schedule a follow-up appointment in: 6 MONTHS WITH DR Eden Emms AND HAVE CAROTID SAME DAY  Your physician recommends that you continue on your current medications as directed. Please refer to the Current Medication list given to you today.  Your physician has requested that you have en exercise stress myoview. For further information please visit [email or web address] Please follow instruction sheet, as given. AT PT'S CONVENIENCE DX CHEST PAIN   Your physician has requested that you have a carotid duplex. This test is an ultrasound of the carotid arteries in your neck. It looks at blood flow through these arteries that supply the brain with blood. Allow one hour for this exam. There are no restrictions or special instructions. 6 MONTHS SEE DR Eden Emms SAME DAY

## 2010-08-01 NOTE — Assessment & Plan Note (Signed)
F/U with Dr Delton Coombes.  Should likely be on CPAP

## 2010-08-01 NOTE — Assessment & Plan Note (Signed)
Doubt cardiac etiology.  Echo normal 12/11.  No significant conduction disease on ECG.  R/O CAD with stress test.  F/U duplex for moderate right ICA disease in 6 months

## 2010-08-01 NOTE — Assessment & Plan Note (Signed)
Multiple CRF;s and known vascular disease.  F/U stress myovue

## 2010-08-08 ENCOUNTER — Ambulatory Visit (HOSPITAL_COMMUNITY): Payer: Self-pay | Attending: Cardiovascular Disease | Admitting: Radiology

## 2010-08-08 DIAGNOSIS — R079 Chest pain, unspecified: Secondary | ICD-10-CM | POA: Insufficient documentation

## 2010-08-08 DIAGNOSIS — R0789 Other chest pain: Secondary | ICD-10-CM

## 2010-08-08 DIAGNOSIS — R55 Syncope and collapse: Secondary | ICD-10-CM

## 2010-08-08 MED ORDER — TECHNETIUM TC 99M TETROFOSMIN IV KIT
11.0000 | PACK | Freq: Once | INTRAVENOUS | Status: AC | PRN
  Administered 2010-08-08: 11 via INTRAVENOUS

## 2010-08-08 MED ORDER — REGADENOSON 0.4 MG/5ML IV SOLN
0.4000 mg | Freq: Once | INTRAVENOUS | Status: AC
  Administered 2010-08-08: 0.4 mg via INTRAVENOUS

## 2010-08-08 MED ORDER — TECHNETIUM TC 99M TETROFOSMIN IV KIT
33.0100 | PACK | Freq: Once | INTRAVENOUS | Status: AC | PRN
  Administered 2010-08-08: 33.01 via INTRAVENOUS

## 2010-08-08 NOTE — Progress Notes (Signed)
[facility] SITE 3 NUCLEAR MED [address] [place] [address] [phone]  Cardiology Nuclear Med Study  Alex Mccann is a 60 y.o. male [id number] January 15, 1951   Nuclear Med Background Indication for Stress Test:  Evaluation for Ischemia History:  12/11 Echo:normal LVF Cardiac Risk Factors: Carotid Disease, History of Smoking, Hypertension, Lipids and RBBB  Symptoms:  Chest Tightness with Exertion (last episode of chest discomfort was this morning in the lobby, 2/10), Diaphoresis, Dizziness, DOE, Syncope, (B) lower extremity cramping with walking, L>R   Nuclear Pre-Procedure Caffeine/Decaff Intake:  None NPO After: 6:00 pm   Lungs:  Clear.  O2 sat 97% on RA. IV 0.9% NS with Angio Cath:  20g  IV Site: R Antecubital  IV Started by:  Stanton Kidney, EMT-P  Chest Size (in):  42 Cup Size: n/a  Height: 6\' 1"  (1.854 m)  Weight:  200 lb (90.719 kg)  BMI:  Body mass index is 26.39 kg/(m^2). Tech Comments:  A.M. Meds. taken today.  Albuterol inhaler used at 10:15 a.m.    Nuclear Med Study 1 or 2 day study: 1 day  Stress Test Type:  Treadmill/Lexiscan  Reading MD: Cassell Clement, MD  Order Authorizing Provider:  Charlton Haws, MD  Resting Radionuclide: Technetium 21m Tetrofosmin  Resting Radionuclide Dose: 11.0 mCi   Stress Radionuclide:  Technetium 56m Tetrofosmin  Stress Radionuclide Dose: 33.0 mCi           Stress Protocol Rest HR: 51 Stress HR: 104  Rest BP: 132/89 Stress BP: 146/71  Exercise Time (min): 5:14 METS: 4.8   Predicted Max HR: 160 bpm % Max HR: 65 bpm Rate Pressure Product: 29528   Dose of Adenosine (mg):  n/a Dose of Lexiscan: 0.4 mg  Dose of Atropine (mg): n/a Dose of Dobutamine: n/a mcg/kg/min (at max HR)  Stress Test Technologist: Smiley Houseman, CMA-N  Nuclear Technologist:  Domenic Polite, CNMT     Rest Procedure:  Myocardial perfusion imaging was performed at rest 45 minutes following the intravenous administration of Technetium  37m Tetrofosmin.  Rest ECG: RBBB  Stress Procedure:  The patient initially walked the treadmill for 3:15 utilizing the Bruce protocol, but was unable to obtain his target heart rate due to leg fatigue.  He was then given IV Lexiscan 0.4 mg over 15-seconds with concurrent low level exercise and then Technetium 38m Tetrofosmin was injected at 30-seconds while the patient continued walking one more minute.  There were no diagnostic ST-T wave changes with Lexiscan.  Quantitative spect images were obtained after a 45-minute delay.  Stress ECG: No significant change from baseline ECG  QPS Raw Data Images:  Normal; no motion artifact; normal heart/lung ratio. Stress Images:  Normal homogeneous uptake in all areas of the myocardium. Rest Images:  Normal homogeneous uptake in all areas of the myocardium. Subtraction (SDS):  No evidence of ischemia. Transient Ischemic Dilatation (Normal <1.22):  0.89 Lung/Heart Ratio (Normal <0.45):  0.29  Quantitative Gated Spect Images QGS EDV:  81 ml QGS ESV:  25 ml QGS cine images:  NL LV Function; NL Wall Motion QGS EF: 69%  Impression Exercise Capacity:  Lexiscan with low level exercise. BP Response:  Normal blood pressure response. Clinical Symptoms:  No chest pain. ECG Impression:  No significant ST segment change suggestive of ischemia. Comparison with Prior Nuclear Study: No previous nuclear study performed  Overall Impression:  Normal stress nuclear study.    Cassell Clement

## 2010-08-08 NOTE — Telephone Encounter (Signed)
OV scheduled for 08/19/2010 w/ RB.

## 2010-08-09 NOTE — Progress Notes (Signed)
Copy routed to Dr.Nishan.Scarlette Ar

## 2010-08-16 NOTE — Progress Notes (Signed)
pt aware of results Alex Mccann  

## 2010-08-19 ENCOUNTER — Encounter: Payer: Self-pay | Admitting: Emergency Medicine

## 2010-08-19 ENCOUNTER — Ambulatory Visit (INDEPENDENT_AMBULATORY_CARE_PROVIDER_SITE_OTHER): Payer: Self-pay | Admitting: Emergency Medicine

## 2010-08-19 DIAGNOSIS — J45909 Unspecified asthma, uncomplicated: Secondary | ICD-10-CM

## 2010-08-19 NOTE — Progress Notes (Signed)
60 hx current tobacco, COPD +/- asthma, HTN. Little other PMH. Has been maintained on Spiriva + Symbicort since 12/2008.   ROV 12/10/09 -- scheduled f/u COPD, dyspnea on exertional and at night. Tells me that he continues to have nocturnal awakenings. These episodes are not reliably relieved by BD. ? snoring, hx possible apneas. Napping daily. Using frequent Ventolin, 4 -5 x a day. Last time we started protonix, his GERD and belching are both better. States thaqt he stopped smoking after our last visit (July), but he does smell of smoke. 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 Holter monitoring performed but he doesn';t know the results yet.   February 18, 2010 --Preesnts for ER follow up. Went to ER 12.17.11, diagnosed w/ PNA. Was given amoxicillin and doxycycline. states feels better but still having some prod cough with yellow mucus,  Gets meds thru [facility]. Xray showed a probable RUL PNA. He is feeling better. Denies chest pain, orthopnea, hemoptysis, fever, n/v/d, edema, headache.   ROV 02/28/10 -- f/u severe COPD/asthma, still smoking occas cigarette. Was treated for CAP 02/16/10. Returns today w f/u CXR, also to review PSG. He tells me that his cough is better, no more sputum. Has wheeze after walking a city block. He continues to have episodes of syncope, no clear etiology. His PSG shows AHI 3, RDI 10.5 (done 01/02/10).   ROV 05/02/10 -- severe COPD, responsive to BD. He describes significant improvement in his symptoms since he moved to new location, with his sister. No longer w second hand smoke, no pets or dust exposure. No exacerbations since last time. Cough is better. Using SABA about 4x a week. Has to stop after about 40 yrds walking. He has stopped smoking altogether, has gained about 20 lbs.   ROV 08/19/10 - severe COPD with BD responsiveness, asthma. He presents today for a dedicated visit to assess functional capacity and to prepare medical statement. He tells me  today that his breathing has done ok since last visit, no exacerbations. Using Spiriva + Symbicort + ventolin prn (~once a day).   Gen: Pleasant, well-nourished, in no distress,  normal affect  ENT: No lesions,  mouth clear,  oropharynx clear, no postnasal drip  Neck: No JVD, no TMG, no carotid bruits  Lungs: Very distant, no wheeze  Cardiovascular: RRR, heart sounds normal, no murmur or gallops, no peripheral edema  Musculoskeletal: No deformities, no cyanosis or clubbing  Neuro: alert, non focal  Skin: Warm, no lesions or rashes

## 2010-08-19 NOTE — Assessment & Plan Note (Signed)
COPD with asthma features - continue same inhaled regimen - functional capacity paperwork filled out today, scanned into our chart - I do not believe he can return to work due to his breathing - rov 4 mon

## 2010-08-19 NOTE — Patient Instructions (Signed)
Please continue your same inhaled medications as you are taking them.  Follow up with Dr Delton Coombes in 4 months or sooner if any problems Flu shot this Fall

## 2010-09-09 ENCOUNTER — Ambulatory Visit: Payer: Self-pay | Admitting: Emergency Medicine

## 2010-09-10 ENCOUNTER — Telehealth: Payer: Self-pay | Admitting: Emergency Medicine

## 2010-09-10 NOTE — Telephone Encounter (Signed)
[id number] to verify what inhalers are needing refills.  Carron Curie, CMA

## 2010-09-11 MED ORDER — ALBUTEROL SULFATE HFA 108 (90 BASE) MCG/ACT IN AERS: 2.0000 | INHALATION_SPRAY | Freq: Four times a day (QID) | RESPIRATORY_TRACT | Status: DC | PRN

## 2010-09-11 MED ORDER — TIOTROPIUM BROMIDE MONOHYDRATE 18 MCG IN CAPS: 18.0000 ug | ORAL_CAPSULE | Freq: Every day | RESPIRATORY_TRACT | Status: DC

## 2010-09-11 MED ORDER — BUDESONIDE-FORMOTEROL FUMARATE 160-4.5 MCG/ACT IN AERO: 2.0000 | INHALATION_SPRAY | Freq: Two times a day (BID) | RESPIRATORY_TRACT | Status: DC

## 2010-09-11 NOTE — Telephone Encounter (Signed)
Pt needing refills on Symbicort, Spiriva and Ventolin HFA. Will send to [facility] per patient request. [facility], [phone].

## 2010-09-16 ENCOUNTER — Telehealth: Payer: Self-pay | Admitting: Emergency Medicine

## 2010-09-16 NOTE — Telephone Encounter (Signed)
I spoke with the pt and he states he thinks it was an old message about his refills. Nothing further needed.Carron Curie, CMA

## 2010-10-02 NOTE — Telephone Encounter (Signed)
done

## 2010-10-10 ENCOUNTER — Encounter: Payer: Self-pay | Admitting: Emergency Medicine

## 2010-10-10 ENCOUNTER — Ambulatory Visit (INDEPENDENT_AMBULATORY_CARE_PROVIDER_SITE_OTHER): Payer: Self-pay | Admitting: Emergency Medicine

## 2010-10-10 DIAGNOSIS — J45909 Unspecified asthma, uncomplicated: Secondary | ICD-10-CM

## 2010-10-10 DIAGNOSIS — J309 Allergic rhinitis, unspecified: Secondary | ICD-10-CM

## 2010-10-10 MED ORDER — LORATADINE 10 MG PO TABS: 10.0000 mg | ORAL_TABLET | Freq: Every day | ORAL | Status: DC

## 2010-10-10 NOTE — Assessment & Plan Note (Signed)
No evidence acute exacerbation at this time.  - same BD regimen

## 2010-10-10 NOTE — Assessment & Plan Note (Signed)
Appears to be flaring after recent exposure.  - add loratadine to nasacort - if he continues to cough will need to hold lisinopril temporarily. He will call in 2 weeks to report on his status.

## 2010-10-10 NOTE — Patient Instructions (Signed)
Please start taking loratadine 10mg  daily for the next month. A script was sent to [facility] Continue your Nasacort nasal spray and omeprazole.  Call our office in 2 weeks to report on how your cough is doing  If you are still having cough at that time we may need to consider stopping your lisinopril temporarily Continue your inhaled medications as you are taking them  Follow up with Dr Delton Coombes in 3 months or sooner if you have any problems.

## 2010-10-10 NOTE — Progress Notes (Signed)
60 hx current tobacco, COPD +/- asthma, HTN. Little other PMH. Has been maintained on Spiriva + Symbicort since 12/2008.   ROV 12/10/09 -- scheduled f/u COPD, dyspnea on exertional and at night. Tells me that he continues to have nocturnal awakenings. These episodes are not reliably relieved by BD. ? snoring, hx possible apneas. Napping daily. Using frequent Ventolin, 4 -5 x a day. Last time we started protonix, his GERD and belching are both better. States thaqt he stopped smoking after our last visit (July), but he does smell of smoke. 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 Holter monitoring performed but he doesn';t know the results yet.   February 18, 2010 --Preesnts for ER follow up. Went to ER 12.17.11, diagnosed w/ PNA. Was given amoxicillin and doxycycline. states feels better but still having some prod cough with yellow mucus,  Gets meds thru [facility]. Xray showed a probable RUL PNA. He is feeling better. Denies chest pain, orthopnea, hemoptysis, fever, n/v/d, edema, headache.   ROV 02/28/10 -- f/u severe COPD/asthma, still smoking occas cigarette. Was treated for CAP 02/16/10. Returns today w f/u CXR, also to review PSG. He tells me that his cough is better, no more sputum. Has wheeze after walking a city block. He continues to have episodes of syncope, no clear etiology. His PSG shows AHI 3, RDI 10.5 (done 01/02/10).   ROV 05/02/10 -- severe COPD, responsive to BD. He describes significant improvement in his symptoms since he moved to new location, with his sister. No longer w second hand smoke, no pets or dust exposure. No exacerbations since last time. Cough is better. Using SABA about 4x a week. Has to stop after about 40 yrds walking. He has stopped smoking altogether, has gained about 20 lbs.   ROV 08/19/10 - severe COPD with BD responsiveness, asthma. He presents today for a dedicated visit to assess functional capacity and to prepare medical statement. He tells me  today that his breathing has done ok since last visit, no exacerbations. Using Spiriva + Symbicort + ventolin prn (~once a day).   ROV 10/10/10 -- severe COPD with BD responsiveness, asthma. Acute visit for worsening cough and nasal congestion, clear drainage. More SOB followed, no real wheeze. Started using SABA more frequently.  He is on lisinopril. He had exposure to some smoke last weekend that seemed to get him started. Cough is minimally productive, clear.     Gen: Pleasant, well-nourished, in no distress,  normal affect  ENT: No lesions,  mouth clear,  oropharynx clear, no postnasal drip  Neck: No JVD, no TMG, no carotid bruits  Lungs: Very distant, no wheeze  Cardiovascular: RRR, heart sounds normal, no murmur or gallops, no peripheral edema  Musculoskeletal: No deformities, no cyanosis or clubbing  Neuro: alert, non focal  Skin: Warm, no lesions or rashes

## 2010-10-14 ENCOUNTER — Telehealth: Payer: Self-pay | Admitting: Emergency Medicine

## 2010-10-14 NOTE — Telephone Encounter (Signed)
Verbal order for Loratadine called to [facility] and the pt is aware.

## 2010-10-17 ENCOUNTER — Other Ambulatory Visit (INDEPENDENT_AMBULATORY_CARE_PROVIDER_SITE_OTHER): Payer: Self-pay

## 2010-10-17 DIAGNOSIS — I6529 Occlusion and stenosis of unspecified carotid artery: Secondary | ICD-10-CM

## 2010-11-01 NOTE — Procedures (Unsigned)
CAROTID DUPLEX EXAM  INDICATION:  Follow up carotid artery disease.  HISTORY: Diabetes:  No. Cardiac:  No. Hypertension:  Yes. Smoking:  Previous. Previous Surgery:  No. CV History:  Currently asymptomatic.  History of syncope and collapse. Amaurosis Fugax No, Paresthesias No, Hemiparesis No.                                      RIGHT             LEFT Brachial systolic pressure:         138               129 Brachial Doppler waveforms:         Normal            Normal Vertebral direction of flow:        Antegrade         Antegrade DUPLEX VELOCITIES (cm/sec) CCA peak systolic                   57                87 ECA peak systolic                   68                80 ICA peak systolic                   118               58 ICA end diastolic                   45                27 PLAQUE MORPHOLOGY:                                    Heterogenous PLAQUE AMOUNT:                      None              Minimal PLAQUE LOCATION:                                      ICA, CCA  IMPRESSION: 1. Doppler velocities suggest 40% to 59% stenosis of the right     internal carotid artery; however, no plaque formation visualized. 2. This may be due to a change in vessel diameter. 3. No hemodynamically significant stenosis of the left internal     carotid artery with minimal plaque formation visualized.  ___________________________________________ Janetta Hora Fields, MD  EM/MEDQ  D:  10/17/2010  T:  10/17/2010  Job:  [id number]

## 2010-12-23 ENCOUNTER — Telehealth: Payer: Self-pay | Admitting: Cardiology

## 2011-01-14 ENCOUNTER — Encounter: Payer: Self-pay | Admitting: Emergency Medicine

## 2011-01-14 ENCOUNTER — Ambulatory Visit (INDEPENDENT_AMBULATORY_CARE_PROVIDER_SITE_OTHER): Payer: Self-pay | Admitting: Emergency Medicine

## 2011-01-14 DIAGNOSIS — K219 Gastro-esophageal reflux disease without esophagitis: Secondary | ICD-10-CM

## 2011-01-14 DIAGNOSIS — J309 Allergic rhinitis, unspecified: Secondary | ICD-10-CM

## 2011-01-14 DIAGNOSIS — J45909 Unspecified asthma, uncomplicated: Secondary | ICD-10-CM

## 2011-01-14 NOTE — Progress Notes (Signed)
60 hx current tobacco, COPD +/- asthma, HTN. Little other PMH. Has been maintained on Spiriva + Symbicort since 12/2008.  His PSG shows AHI 3, RDI 10.5 (done 01/02/10).   ROV 05/02/10 -- severe COPD, responsive to BD. He describes significant improvement in his symptoms since he moved to new location, with his sister. No longer w second hand smoke, no pets or dust exposure. No exacerbations since last time. Cough is better. Using SABA about 4x a week. Has to stop after about 40 yrds walking. He has stopped smoking altogether, has gained about 20 lbs.   ROV 08/19/10 - severe COPD with BD responsiveness, asthma. He presents today for a dedicated visit to assess functional capacity and to prepare medical statement. He tells me today that his breathing has done ok since last visit, no exacerbations. Using Spiriva + Symbicort + ventolin prn (~once a day).   ROV 10/10/10 -- severe COPD with BD responsiveness, asthma. Acute visit for worsening cough and nasal congestion, clear drainage. More SOB followed, no real wheeze. Started using SABA more frequently.  He is on lisinopril. He had exposure to some smoke last weekend that seemed to get him started. Cough is minimally productive, clear.   ROV 01/14/11 -- severe COPD with BD responsiveness, asthma. Not smoking currently, had a single flare since last time that was caused by perfume exposure, he used SABA and got better, did not require pred or abx. He has been coughing, again seems to be exposures to dust, etc. Maintained on Spiriva + Symbicort. Taking PPI reliably.    EXAM:  Gen: Pleasant, well-nourished, in no distress,  normal affect  ENT: No lesions,  mouth clear,  oropharynx clear, no postnasal drip  Neck: No JVD, no TMG, no carotid bruits  Lungs: Very distant, no wheeze  Cardiovascular: RRR, heart sounds normal, no murmur or gallops, no peripheral edema  Musculoskeletal: No deformities, no cyanosis or clubbing  Neuro: alert, non focal  Skin:  Warm, no lesions or rashes  ASTHMA Appears to be clinically stable on full therapy.  - Spiriva + Symbicort - prn SABA - offered flu shot, he declined.   ALLERGIC RHINITIS - fluticasone spray - loratadine   GERD - PPI as ordered

## 2011-01-14 NOTE — Assessment & Plan Note (Signed)
-   fluticasone spray - loratadine

## 2011-01-14 NOTE — Assessment & Plan Note (Signed)
PPI as ordered 

## 2011-01-14 NOTE — Assessment & Plan Note (Signed)
Appears to be clinically stable on full therapy.  - Spiriva + Symbicort - prn SABA - offered flu shot, he declined.

## 2011-01-14 NOTE — Patient Instructions (Signed)
Please continue your Spiriva and Symbicort Use your rescue inhaler as needed Continue your loratadine and fluticasone nasal spray You can use over-the-counter decongestants that contain these ingredients: chlorphenerimine or bromphenerimine Follow with Dr Delton Coombes in 4 months or sooner if you have any problems.  Call our office if you change your mind about getting the flu shot

## 2011-01-17 ENCOUNTER — Ambulatory Visit: Payer: Self-pay | Admitting: Emergency Medicine

## 2011-02-04 ENCOUNTER — Encounter: Payer: Self-pay | Admitting: *Deleted

## 2011-02-04 ENCOUNTER — Ambulatory Visit: Payer: Self-pay | Admitting: Cardiovascular Disease

## 2011-02-05 ENCOUNTER — Encounter: Payer: Self-pay | Admitting: Cardiology

## 2011-02-12 ENCOUNTER — Ambulatory Visit (INDEPENDENT_AMBULATORY_CARE_PROVIDER_SITE_OTHER): Payer: Self-pay | Admitting: Cardiovascular Disease

## 2011-02-12 ENCOUNTER — Encounter: Payer: Self-pay | Admitting: Cardiovascular Disease

## 2011-02-12 ENCOUNTER — Encounter (INDEPENDENT_AMBULATORY_CARE_PROVIDER_SITE_OTHER): Payer: Self-pay | Admitting: Cardiology

## 2011-02-12 VITALS — BP 137/92 | HR 76 | Wt 204.0 lb

## 2011-02-12 DIAGNOSIS — I779 Disorder of arteries and arterioles, unspecified: Secondary | ICD-10-CM

## 2011-02-12 DIAGNOSIS — R55 Syncope and collapse: Secondary | ICD-10-CM

## 2011-02-12 DIAGNOSIS — I10 Essential (primary) hypertension: Secondary | ICD-10-CM

## 2011-02-12 DIAGNOSIS — I6529 Occlusion and stenosis of unspecified carotid artery: Secondary | ICD-10-CM

## 2011-02-12 DIAGNOSIS — R0989 Other specified symptoms and signs involving the circulatory and respiratory systems: Secondary | ICD-10-CM

## 2011-02-12 NOTE — Assessment & Plan Note (Signed)
Well controlled.  Continue current medications and low sodium Dash type diet.    

## 2011-02-12 NOTE — Assessment & Plan Note (Signed)
Presyncope appears to be noncardiac in nature  F/U [facility]

## 2011-02-12 NOTE — Patient Instructions (Signed)
Your physician recommends that you schedule a follow-up appointment in: 6 months  Your physician has requested that you have a carotid duplex. This test is an ultrasound of the carotid arteries in your neck. It looks at blood flow through these arteries that supply the brain with blood. Allow one hour for this exam. There are no restrictions or special instructions.    

## 2011-02-12 NOTE — Assessment & Plan Note (Signed)
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  ASA  F/U duplex in 6 months

## 2011-02-12 NOTE — Progress Notes (Signed)
60 yo previous smoker referred by [facility] for ? Syncope. Review records in echart with normal echo 12/11. Quit smoking last year. Episodes related to heat. Hasn't hurt himself. ? Passes out for 60 seconds. 3-4 episodes last year. Denies ETOH or drugs. No associated palpiations. Mild chronic dyspnea with exertion from COPD. Sees Dr Delton Coombes. Has gained 60 lbs over the last 4 years. Does not wear CPAP. Has some SSCP. Not always exertional. Cant tell if it is gas. Does not last but a minute. Has known 40-59% RICA disease from duplex last year. CRF HTN and elevated lipids. No history of CHF, arrythmia, or PE.   No episodes of syncope since I last saw him  Nuclear study 08/08/10  Normal with no ischemia and EF 69% Carotids from today 60-79% mid RCA  ROS: Denies fever, malais, weight loss, blurry vision, decreased visual acuity, cough, sputum, SOB, hemoptysis, pleuritic pain, palpitaitons, heartburn, abdominal pain, melena, lower extremity edema, claudication, or rash.  All other systems reviewed and negative  General: Affect appropriate Healthy:  appears stated age HEENT: normal Neck supple with no adenopathy JVP normal no bruits no thyromegaly Lungs clear with no wheezing and good diaphragmatic motion Heart:  S1/S2 no murmur,rub, gallop or click PMI normal Abdomen: benighn, BS positve, no tenderness, no AAA no bruit.  No HSM or HJR Distal pulses intact with no bruits No edema Neuro non-focal Skin warm and dry No muscular weakness   Current Outpatient Prescriptions  Medication Sig Dispense Refill  . albuterol (VENTOLIN HFA) 108 (90 BASE) MCG/ACT inhaler Inhale 2 puffs into the lungs every 6 (six) hours as needed.  1 Inhaler  5  . amLODipine (NORVASC) 5 MG tablet Take 5 mg by mouth daily.        Marland Kitchen aspirin 81 MG tablet Take 81 mg by mouth daily.        . budesonide-formoterol (SYMBICORT) 160-4.5 MCG/ACT inhaler Inhale 2 puffs into the lungs 2 (two) times daily.  1 Inhaler  5  . lisinopril  (PRINIVIL,ZESTRIL) 40 MG tablet Take 40 mg by mouth daily.        Marland Kitchen loratadine (CLARITIN) 10 MG tablet Take 1 tablet (10 mg total) by mouth daily.  30 tablet  11  . pantoprazole (PROTONIX) 40 MG tablet Take 40 mg by mouth daily.        . pravastatin (PRAVACHOL) 40 MG tablet Take 40 mg by mouth daily.        Marland Kitchen tiotropium (SPIRIVA) 18 MCG inhalation capsule Place 1 capsule (18 mcg total) into inhaler and inhale daily.  30 capsule  5  . triamcinolone (NASACORT) 55 MCG/ACT nasal inhaler 2 sprays by Nasal route daily.          Allergies  Review of patient's allergies indicates no known allergies.  Electrocardiogram:  Assessment and Plan

## 2011-02-14 ENCOUNTER — Encounter: Payer: Self-pay | Admitting: Cardiovascular Disease

## 2011-03-06 IMAGING — CR DG CHEST 2V
2 series · 2 of 2 positions shown · non-contrast
Comparison: Chest x-ray 12/26/2009

CLINICAL DATA: Productive cough.  Shortness of breath.

CHEST - 2 VIEW

[w chest pa]
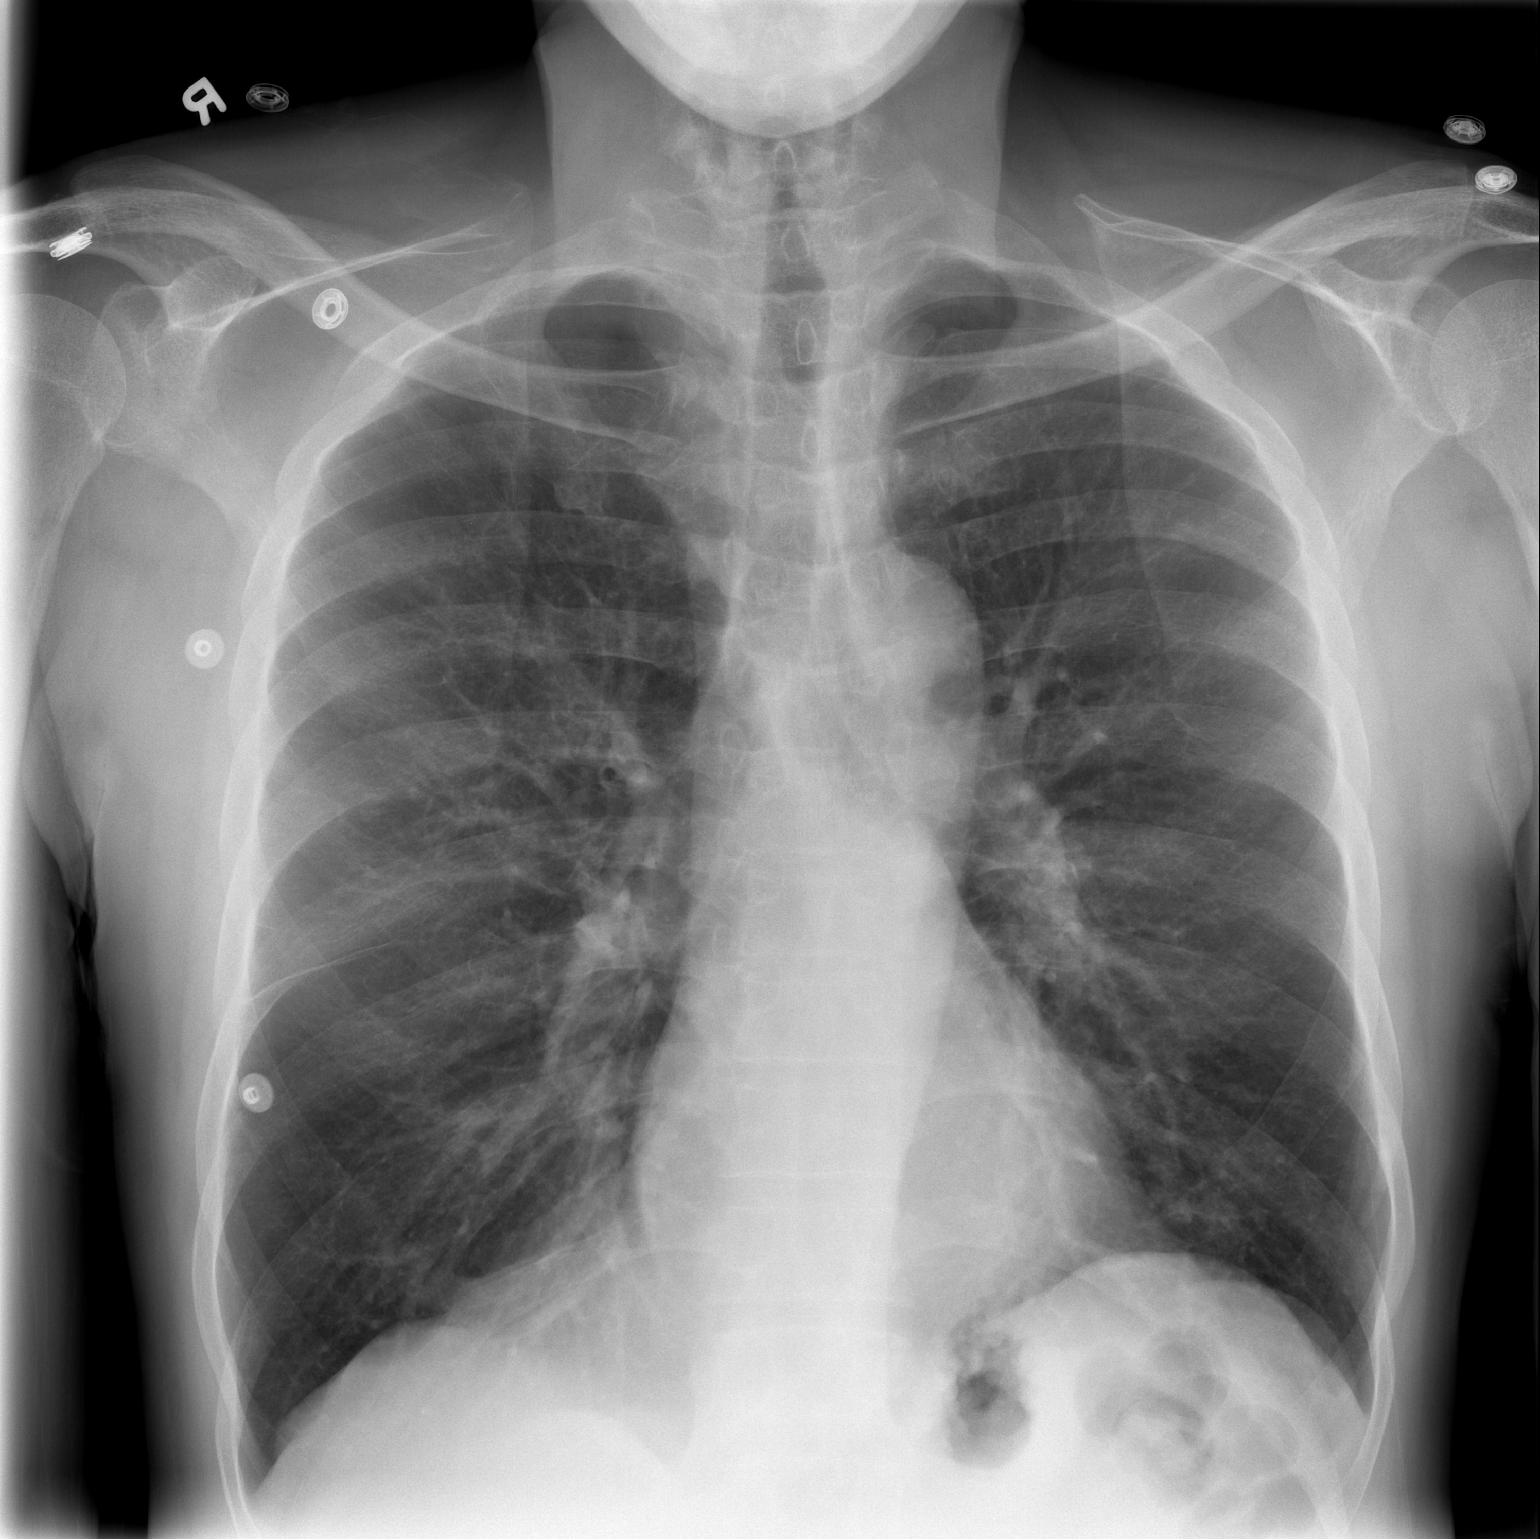

[w chest lat]
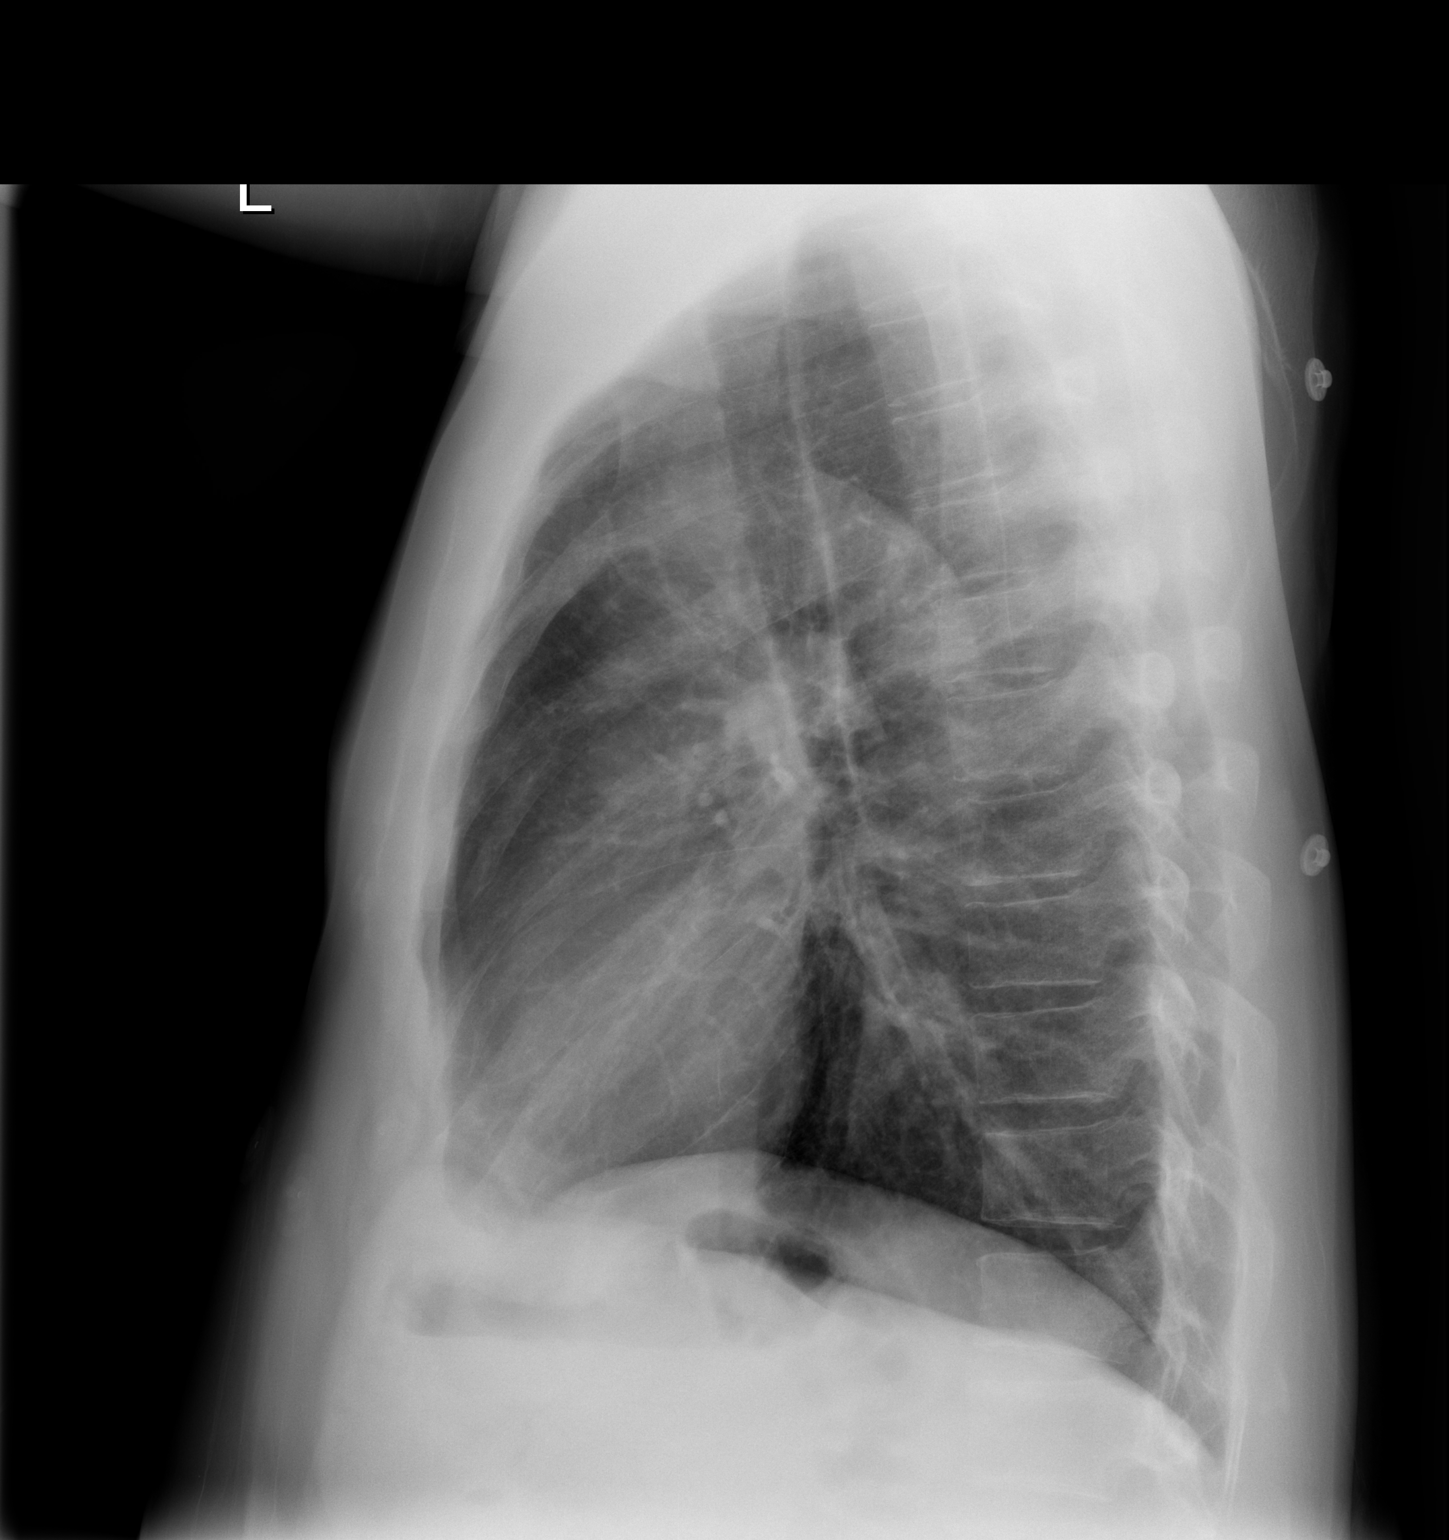

[2 of 2 positions shown; findings below may reference images not displayed]

FINDINGS: The cardiac silhouette, mediastinal and hilar contours
are within normal limits and stable.  Ill-defined density in the
right upper lobe could be a developing infiltrate.  No edema or
effusions.  The bony thorax is intact.
IMPRESSION: Probable developing right upper lobe pneumonia.

## 2011-05-09 ENCOUNTER — Encounter: Payer: Self-pay | Admitting: Emergency Medicine

## 2011-05-09 ENCOUNTER — Ambulatory Visit (INDEPENDENT_AMBULATORY_CARE_PROVIDER_SITE_OTHER): Payer: Self-pay | Admitting: Emergency Medicine

## 2011-05-09 DIAGNOSIS — J449 Chronic obstructive pulmonary disease, unspecified: Secondary | ICD-10-CM

## 2011-05-09 DIAGNOSIS — J309 Allergic rhinitis, unspecified: Secondary | ICD-10-CM

## 2011-05-09 NOTE — Progress Notes (Signed)
60 hx current tobacco, COPD +/- asthma, HTN. Little other PMH. Has been maintained on Spiriva + Symbicort since 12/2008.  His PSG shows AHI 3, RDI 10.5 (done 01/02/10).   ROV 05/02/10 -- severe COPD, responsive to BD. He describes significant improvement in his symptoms since he moved to new location, with his sister. No longer w second hand smoke, no pets or dust exposure. No exacerbations since last time. Cough is better. Using SABA about 4x a week. Has to stop after about 40 yrds walking. He has stopped smoking altogether, has gained about 20 lbs.   ROV 08/19/10 - severe COPD with BD responsiveness, asthma. He presents today for a dedicated visit to assess functional capacity and to prepare medical statement. He tells me today that his breathing has done ok since last visit, no exacerbations. Using Spiriva + Symbicort + ventolin prn (~once a day).   ROV 10/10/10 -- severe COPD with BD responsiveness, asthma. Acute visit for worsening cough and nasal congestion, clear drainage. More SOB followed, no real wheeze. Started using SABA more frequently.  He is on lisinopril. He had exposure to some smoke last weekend that seemed to get him started. Cough is minimally productive, clear.   ROV 01/14/11 -- severe COPD with BD responsiveness, asthma. Not smoking currently, had a single flare since last time that was caused by perfume exposure, he used SABA and got better, did not require pred or abx. He has been coughing, again seems to be exposures to dust, etc. Maintained on Spiriva + Symbicort. Taking PPI reliably.   ROV 05/09/11 -- severe COPD with BD responsiveness, asthma. Has been having some difficulty getting His meds due to increased cost, still going to [facility]. No flares since last time. Many days goes without his ventoliin, some days needs 2x. Stable on Spiriva and Symbicort. Cough has been worse since he ran out of loratadine and nasacort.   EXAM:  Gen: Pleasant, well-nourished, in no distress,   normal affect  ENT: No lesions,  mouth clear,  oropharynx clear, no postnasal drip  Neck: No JVD, no TMG, no carotid bruits  Lungs: Very distant, no wheeze  Cardiovascular: RRR, heart sounds normal, no murmur or gallops, no peripheral edema  Musculoskeletal: No deformities, no cyanosis or clubbing  Neuro: alert, non focal  Skin: Warm, no lesions or rashes  ALLERGIC RHINITIS He ran out of loratadine, nasal steroid. Will give nasal steroid sample today  COPD Doing quite well.  - same meds - rov 3 months or prn

## 2011-05-09 NOTE — Assessment & Plan Note (Signed)
He ran out of loratadine, nasal steroid. Will give nasal steroid sample today

## 2011-05-09 NOTE — Assessment & Plan Note (Signed)
Doing quite well.  - same meds - rov 3 months or prn

## 2011-05-09 NOTE — Progress Notes (Signed)
Addended by: [name] A on: 05/09/2011 10:43 AM   Modules accepted: Orders

## 2011-05-09 NOTE — Patient Instructions (Addendum)
Please continue Spiriva and Symbicort as you have been taking them Try to restart loratadine once a day Use Nasonex 2 sprays each nostril daily in place of your nasacort Follow with Dr Delton Coombes in 3 months or sooner if you have any problems.

## 2011-08-18 ENCOUNTER — Encounter: Payer: Self-pay | Admitting: Emergency Medicine

## 2011-08-18 ENCOUNTER — Ambulatory Visit (INDEPENDENT_AMBULATORY_CARE_PROVIDER_SITE_OTHER): Payer: Self-pay | Admitting: Emergency Medicine

## 2011-08-18 VITALS — BP 132/80 | HR 72 | Ht 73.0 in | Wt 205.0 lb

## 2011-08-18 DIAGNOSIS — J449 Chronic obstructive pulmonary disease, unspecified: Secondary | ICD-10-CM

## 2011-08-18 DIAGNOSIS — J4489 Other specified chronic obstructive pulmonary disease: Secondary | ICD-10-CM

## 2011-08-18 DIAGNOSIS — J309 Allergic rhinitis, unspecified: Secondary | ICD-10-CM

## 2011-08-18 NOTE — Assessment & Plan Note (Signed)
Will give samples of a nasal steroid Restart loratadine

## 2011-08-18 NOTE — Assessment & Plan Note (Signed)
Continue the spiriva and symbicort SABA prn

## 2011-08-18 NOTE — Patient Instructions (Addendum)
Continue your inhaled medications as you are taking them Restart your loratadine (Claritin) once a day Use your Nasonex spray, 1 puff each nostril daily Follow with Dr Delton Coombes in 3 months or sooner if you have any problems.

## 2011-08-18 NOTE — Progress Notes (Signed)
61 hx current tobacco, COPD +/- asthma, HTN. Little other PMH. Has been maintained on Spiriva + Symbicort since 12/2008.  His PSG shows AHI 3, RDI 10.5 (done 01/02/10).   ROV 05/02/10 -- severe COPD, responsive to BD. He describes significant improvement in his symptoms since he moved to new location, with his sister. No longer w second hand smoke, no pets or dust exposure. No exacerbations since last time. Cough is better. Using SABA about 4x a week. Has to stop after about 40 yrds walking. He has stopped smoking altogether, has gained about 20 lbs.   ROV 08/19/10 - severe COPD with BD responsiveness, asthma. He presents today for a dedicated visit to assess functional capacity and to prepare medical statement. He tells me today that his breathing has done ok since last visit, no exacerbations. Using Spiriva + Symbicort + ventolin prn (~once a day).   ROV 10/10/10 -- severe COPD with BD responsiveness, asthma. Acute visit for worsening cough and nasal congestion, clear drainage. More SOB followed, no real wheeze. Started using SABA more frequently.  He is on lisinopril. He had exposure to some smoke last weekend that seemed to get him started. Cough is minimally productive, clear.   ROV 01/14/11 -- severe COPD with BD responsiveness, asthma. Not smoking currently, had a single flare since last time that was caused by perfume exposure, he used SABA and got better, did not require pred or abx. He has been coughing, again seems to be exposures to dust, etc. Maintained on Spiriva + Symbicort. Taking PPI reliably.   ROV 05/09/11 -- severe COPD with BD responsiveness, asthma. Has been having some difficulty getting His meds due to increased cost, still going to [facility]. No flares since last time. Many days goes without his ventoliin, some days needs 2x. Stable on Spiriva and Symbicort. Cough has been worse since he ran out of loratadine and nasacort.   ROV 08/18/11 -- severe COPD with BD responsiveness, asthma.  Also allergic rhinitis. Has been having more trouble breathing last few weeks, ? Due to the heat or to more pollen exposure.  Wheeze is stable, having more sneezing and cough. Has nasal steroid that he uses prn. Doesn't have any loratadine right now. No exacerbations since last time.   EXAM:  Filed Vitals:   08/18/11 1627  BP: 132/80  Pulse: 72   Gen: Pleasant, well-nourished, in no distress,  normal affect  ENT: No lesions,  mouth clear,  oropharynx clear, no postnasal drip  Neck: No JVD, no TMG, no carotid bruits  Lungs: Very distant, no wheeze  Cardiovascular: RRR, heart sounds normal, no murmur or gallops, no peripheral edema  Musculoskeletal: No deformities, no cyanosis or clubbing  Neuro: alert, non focal  Skin: Warm, no lesions or rashes  COPD Continue the spiriva and symbicort SABA prn   ALLERGIC RHINITIS Will give samples of a nasal steroid Restart loratadine

## 2011-09-11 ENCOUNTER — Other Ambulatory Visit: Payer: Self-pay | Admitting: *Deleted

## 2011-09-11 DIAGNOSIS — I6529 Occlusion and stenosis of unspecified carotid artery: Secondary | ICD-10-CM

## 2011-09-12 ENCOUNTER — Encounter: Payer: Self-pay | Admitting: Cardiovascular Disease

## 2011-09-12 ENCOUNTER — Encounter (INDEPENDENT_AMBULATORY_CARE_PROVIDER_SITE_OTHER): Payer: Self-pay

## 2011-09-12 ENCOUNTER — Ambulatory Visit (INDEPENDENT_AMBULATORY_CARE_PROVIDER_SITE_OTHER): Payer: Self-pay | Admitting: Cardiovascular Disease

## 2011-09-12 VITALS — BP 140/87 | HR 53 | Ht 73.0 in | Wt 205.0 lb

## 2011-09-12 DIAGNOSIS — I6529 Occlusion and stenosis of unspecified carotid artery: Secondary | ICD-10-CM

## 2011-09-12 DIAGNOSIS — J449 Chronic obstructive pulmonary disease, unspecified: Secondary | ICD-10-CM

## 2011-09-12 DIAGNOSIS — J4489 Other specified chronic obstructive pulmonary disease: Secondary | ICD-10-CM

## 2011-09-12 DIAGNOSIS — I779 Disorder of arteries and arterioles, unspecified: Secondary | ICD-10-CM

## 2011-09-12 DIAGNOSIS — I2510 Atherosclerotic heart disease of native coronary artery without angina pectoris: Secondary | ICD-10-CM

## 2011-09-12 DIAGNOSIS — I10 Essential (primary) hypertension: Secondary | ICD-10-CM

## 2011-09-12 MED ORDER — LOSARTAN POTASSIUM 100 MG PO TABS: 100.0000 mg | ORAL_TABLET | Freq: Every day | ORAL | Status: DC

## 2011-09-12 NOTE — Patient Instructions (Signed)
Your physician wants you to follow-up in: YEAR WITH DR Haywood Filler will receive a reminder letter in the mail two months in advance. If you don't receive a letter, please call our office to schedule the follow-up appointment. Your physician has recommended you make the following change in your medication: STOP LISINOPRIL START LOSARTAN 100 MG 1 TAB EVERY DAY Your physician has requested that you have a carotid duplex. This test is an ultrasound of the carotid arteries in your neck. It looks at blood flow through these arteries that supply the brain with blood. Allow one hour for this exam. There are no restrictions or special instructions. DUE IN  6 MONTHS DX CAROTID STENOSIS

## 2011-09-12 NOTE — Progress Notes (Signed)
Patient ID: Alex Mccann, male   DOB: 1950-08-08, 61 y.o.   MRN: [id number]  61 yo previous smoker referred by [facility] initially in 2012  for ? Syncope. Normal echo 12/11. Quit smoking last year 2012 . Episodes related to heat   Denies ETOH or drugs. No associated palpiations. Mild chronic dyspnea with exertion from COPD. Sees Dr Delton Coombes. Has gained 60 lbs over the last 4 years. Does not wear CPAP. Has some SSCP. Not always exertional. Cant tell if it is gas. Does not last but a minute.  CRF HTN and elevated lipids. No history of CHF, arrythmia, or PE.   No episodes of syncope since I last saw him  Nuclear study 08/08/10 Normal with no ischemia and EF 69%  Carotids from today 60-79% right ICA stenosis  ROS: Denies fever, malais, weight loss, blurry vision, decreased visual acuity, cough, sputum, SOB, hemoptysis, pleuritic pain, palpitaitons, heartburn, abdominal pain, melena, lower extremity edema, claudication, or rash.  All other systems reviewed and negative  General: Affect appropriate Healthy:  appears stated age HEENT: normal Neck supple with no adenopathy JVP normal left bruits no thyromegaly Lungs clear with no wheezing and good diaphragmatic motion Heart:  S1/S2 no murmur, no rub, gallop or click PMI normal Abdomen: benighn, BS positve, no tenderness, no AAA no bruit.  No HSM or HJR Distal pulses intact with no bruits No edema Neuro non-focal Skin warm and dry No muscular weakness   Current Outpatient Prescriptions  Medication Sig Dispense Refill  . albuterol (VENTOLIN HFA) 108 (90 BASE) MCG/ACT inhaler Inhale 2 puffs into the lungs every 6 (six) hours as needed.  1 Inhaler  5  . amLODipine (NORVASC) 5 MG tablet Take 5 mg by mouth daily.        Marland Kitchen aspirin 81 MG tablet Take 81 mg by mouth daily.        . budesonide-formoterol (SYMBICORT) 160-4.5 MCG/ACT inhaler Inhale 2 puffs into the lungs 2 (two) times daily.  1 Inhaler  5  . lisinopril (PRINIVIL,ZESTRIL) 40 MG tablet Take 40  mg by mouth daily.        Marland Kitchen loratadine (CLARITIN) 10 MG tablet Take 1 tablet (10 mg total) by mouth daily.  30 tablet  11  . pantoprazole (PROTONIX) 40 MG tablet Take 40 mg by mouth daily.        . pravastatin (PRAVACHOL) 40 MG tablet Take 40 mg by mouth daily.        Marland Kitchen tiotropium (SPIRIVA) 18 MCG inhalation capsule Place 1 capsule (18 mcg total) into inhaler and inhale daily.  30 capsule  5  . triamcinolone (NASACORT) 55 MCG/ACT nasal inhaler 2 sprays by Nasal route daily.          Allergies  Review of patient's allergies indicates no known allergies.  Electrocardiogram:  SB rate 53 PR 153 LAD RBBB  No change from 08/01/10  Assessment and Plan

## 2011-09-12 NOTE — Assessment & Plan Note (Signed)
Stable left ICA 60-79% by duplex today.  F/U 6 months ASA

## 2011-09-12 NOTE — Assessment & Plan Note (Signed)
D/C smoking 2012  F/U Dr Delton Coombes.  Stable

## 2011-09-12 NOTE — Assessment & Plan Note (Signed)
Change lisinopril to cozaar to see if dehydrated feeling and skin issues improve

## 2011-10-03 ENCOUNTER — Encounter (HOSPITAL_BASED_OUTPATIENT_CLINIC_OR_DEPARTMENT_OTHER): Payer: Self-pay

## 2011-10-03 ENCOUNTER — Emergency Department (HOSPITAL_BASED_OUTPATIENT_CLINIC_OR_DEPARTMENT_OTHER): Payer: Self-pay

## 2011-10-03 ENCOUNTER — Emergency Department (HOSPITAL_BASED_OUTPATIENT_CLINIC_OR_DEPARTMENT_OTHER)
Admission: EM | Admit: 2011-10-03 | Discharge: 2011-10-03 | Disposition: A | Discharge status: Home / Self Care | Payer: Self-pay | Attending: Emergency Medicine | Admitting: Emergency Medicine

## 2011-10-03 DIAGNOSIS — E785 Hyperlipidemia, unspecified: Secondary | ICD-10-CM | POA: Insufficient documentation

## 2011-10-03 DIAGNOSIS — J40 Bronchitis, not specified as acute or chronic: Secondary | ICD-10-CM

## 2011-10-03 DIAGNOSIS — J4489 Other specified chronic obstructive pulmonary disease: Secondary | ICD-10-CM | POA: Insufficient documentation

## 2011-10-03 DIAGNOSIS — I10 Essential (primary) hypertension: Secondary | ICD-10-CM | POA: Insufficient documentation

## 2011-10-03 DIAGNOSIS — Z87891 Personal history of nicotine dependence: Secondary | ICD-10-CM | POA: Insufficient documentation

## 2011-10-03 DIAGNOSIS — J449 Chronic obstructive pulmonary disease, unspecified: Secondary | ICD-10-CM

## 2011-10-03 HISTORY — DX: Hyperlipidemia, unspecified: E78.5

## 2011-10-03 MED ORDER — ALBUTEROL SULFATE (5 MG/ML) 0.5% IN NEBU: 5.0000 mg | INHALATION_SOLUTION | Freq: Once | RESPIRATORY_TRACT | Status: DC

## 2011-10-03 MED ORDER — IPRATROPIUM BROMIDE 0.02 % IN SOLN
0.5000 mg | Freq: Once | RESPIRATORY_TRACT | Status: AC
  Administered 2011-10-03: 0.5 mg via RESPIRATORY_TRACT

## 2011-10-03 MED ORDER — IPRATROPIUM BROMIDE 0.02 % IN SOLN
0.5000 mg | Freq: Once | RESPIRATORY_TRACT | Status: AC
  Administered 2011-10-03: 0.5 mg via RESPIRATORY_TRACT
  Filled 2011-10-03: qty 2.5

## 2011-10-03 MED ORDER — ALBUTEROL SULFATE (5 MG/ML) 0.5% IN NEBU
5.0000 mg | INHALATION_SOLUTION | Freq: Once | RESPIRATORY_TRACT | Status: AC
  Administered 2011-10-03: 5 mg via RESPIRATORY_TRACT

## 2011-10-03 MED ORDER — PREDNISONE 20 MG PO TABS
40.0000 mg | ORAL_TABLET | Freq: Once | ORAL | Status: AC
  Administered 2011-10-03: 40 mg via ORAL
  Filled 2011-10-03: qty 2

## 2011-10-03 MED ORDER — ALBUTEROL SULFATE (5 MG/ML) 0.5% IN NEBU
INHALATION_SOLUTION | RESPIRATORY_TRACT | Status: AC
  Filled 2011-10-03: qty 0.5

## 2011-10-03 MED ORDER — IPRATROPIUM BROMIDE 0.02 % IN SOLN
RESPIRATORY_TRACT | Status: AC
  Administered 2011-10-03: 0.5 mg via RESPIRATORY_TRACT
  Filled 2011-10-03: qty 2.5

## 2011-10-03 MED ORDER — ALBUTEROL SULFATE (5 MG/ML) 0.5% IN NEBU
5.0000 mg | INHALATION_SOLUTION | Freq: Once | RESPIRATORY_TRACT | Status: AC
  Administered 2011-10-03: 5 mg via RESPIRATORY_TRACT
  Filled 2011-10-03: qty 1

## 2011-10-03 MED ORDER — PREDNISONE 20 MG PO TABS: 40.0000 mg | ORAL_TABLET | Freq: Once | ORAL | Status: AC

## 2011-10-03 MED ORDER — ALBUTEROL SULFATE (5 MG/ML) 0.5% IN NEBU
INHALATION_SOLUTION | RESPIRATORY_TRACT | Status: AC
  Administered 2011-10-03: 5 mg via RESPIRATORY_TRACT
  Filled 2011-10-03: qty 1

## 2011-10-03 MED ORDER — ALBUTEROL (5 MG/ML) CONTINUOUS INHALATION SOLN
10.0000 mg/h | INHALATION_SOLUTION | RESPIRATORY_TRACT | Status: DC
  Administered 2011-10-03: 10 mg/h via RESPIRATORY_TRACT

## 2011-10-03 MED ORDER — AMOXICILLIN 500 MG PO CAPS: 500.0000 mg | ORAL_CAPSULE | Freq: Three times a day (TID) | ORAL | Status: AC

## 2011-10-03 MED ORDER — IPRATROPIUM BROMIDE 0.02 % IN SOLN: 0.5000 mg | Freq: Once | RESPIRATORY_TRACT | Status: DC

## 2011-10-03 NOTE — ED Provider Notes (Signed)
History     CSN: [id number]  Arrival date & time 10/03/11  4098   First MD Initiated Contact with Patient 10/03/11 1835      Chief Complaint  Patient presents with  . Shortness of Breath  . Cough    (Consider location/radiation/quality/duration/timing/severity/associated sxs/prior treatment) HPI Comments: Patient with a known history of COPD, no longer smoking was doing some cleaning with some decreasing agents and a smaller in} approximately 2 days ago and since then has had worsening in breathing, chest tightness similar to prior exacerbations of COPD as well as a cough productive of clear sputum. He denies fever or chills. He denies feeling faint, lightheaded or dizziness. He denies sweating, nausea or vomiting. He does have inhalers and has been using them as directed with no significant improvement in his symptoms, therefore causing him to come here for further evaluation. He denies prior history of intubation or ventilation. He does see the [place] [facility]. [facility] and although [facility] closing, he is already put in paperwork to establish with a new clinic in [facility] area.  Patient is a 61 y.o. male presenting with shortness of breath and cough. The history is provided by the patient.  Shortness of Breath  Associated symptoms include cough and shortness of breath. Pertinent negatives include no fever.  Cough Associated symptoms include shortness of breath. Pertinent negatives include no chills.    Past Medical History  Diagnosis Date  . Acute respiratory failure   . Asthma   . COPD (chronic obstructive pulmonary disease)   . HTN (hypertension)   . Hyperlipemia     Past Surgical History  Procedure Date  . Tonsillectomy   . Stomach surgery     Family History  Problem Relation Age of Onset  . Allergies Sister   . Heart disease Brother     History  Substance Use Topics  . Smoking status: Former Smoker -- 2.0 packs/day for 49 years   Quit date: 01/31/2010  . Smokeless tobacco: Never Used  . Alcohol Use: Yes     occasional      Review of Systems  Constitutional: Negative for fever, chills, diaphoresis and activity change.  HENT: Negative for congestion and postnasal drip.   Respiratory: Positive for cough, chest tightness and shortness of breath.   Cardiovascular: Negative for palpitations and leg swelling.  Gastrointestinal: Negative for nausea, vomiting and abdominal pain.  All other systems reviewed and are negative.    Allergies  Review of patient's allergies indicates no known allergies.  Home Medications   Current Outpatient Rx  Name Route Sig Dispense Refill  . ALBUTEROL SULFATE HFA 108 (90 BASE) MCG/ACT IN AERS Inhalation Inhale 2 puffs into the lungs every 6 (six) hours as needed. For wheezing and shortness of breath.    . AMLODIPINE BESYLATE 5 MG PO TABS Oral Take 5 mg by mouth daily.      . ASPIRIN 81 MG PO TABS Oral Take 81 mg by mouth daily.      . BUDESONIDE-FORMOTEROL FUMARATE 160-4.5 MCG/ACT IN AERO Inhalation Inhale 2 puffs into the lungs 2 (two) times daily.    Marland Kitchen LORATADINE 10 MG PO TABS Oral Take 1 tablet (10 mg total) by mouth daily. 30 tablet 11  . LOSARTAN POTASSIUM 100 MG PO TABS Oral Take 1 tablet (100 mg total) by mouth daily. 30 tablet 11  . PANTOPRAZOLE SODIUM 40 MG PO TBEC Oral Take 40 mg by mouth daily.      Marland Kitchen  TIOTROPIUM BROMIDE MONOHYDRATE 18 MCG IN CAPS Inhalation Place 1 capsule (18 mcg total) into inhaler and inhale daily. 30 capsule 5  . TRIAMCINOLONE ACETONIDE 55 MCG/ACT NA INHA Nasal Place 2 sprays into the nose daily.     . AMOXICILLIN 500 MG PO CAPS Oral Take 1 capsule (500 mg total) by mouth 3 (three) times daily. 21 capsule 0  . PREDNISONE 20 MG PO TABS Oral Take 2 tablets (40 mg total) by mouth once. 10 tablet 0    BP 134/81  Pulse 83  Temp 98.5 F (36.9 C) (Oral)  Resp 20  Ht 6\' 1"  (1.854 m)  Wt 204 lb (92.534 kg)  BMI 26.91 kg/m2  SpO2 100%  Physical Exam    Nursing note and vitals reviewed. Constitutional: He is oriented to person, place, and time. He appears well-developed and well-nourished.  HENT:  Head: Normocephalic and atraumatic.  Eyes: Pupils are equal, round, and reactive to light. No scleral icterus.  Neck: Normal range of motion. Neck supple.  Cardiovascular: Normal rate.   Pulmonary/Chest: Accessory muscle usage present. Tachypnea noted. He is in respiratory distress. He has decreased breath sounds. He has wheezes. He has no rhonchi. He has no rales.       Patient with mild respiratory distress, decreased breath sounds throughout with wheezing scattered through both lung fields. No significant asymmetry is noted in his breath sounds laterally.  Abdominal: Soft. Bowel sounds are normal.  Musculoskeletal: He exhibits no edema and no tenderness.  Neurological: He is alert and oriented to person, place, and time.  Skin: Skin is warm and dry. No rash noted.    ED Course  Procedures (including critical care time)  Labs Reviewed - No data to display Dg Chest 2 View  10/03/2011  *RADIOLOGY REPORT*  Clinical Data: 61 year old male with cough and shortness of breath.  CHEST - 2 VIEW  Comparison: 02/28/2010 and prior chest radiographs  Findings: The cardiomediastinal silhouette is unremarkable. COPD/emphysema identified. There is no evidence of focal airspace disease, pulmonary edema, suspicious pulmonary nodule/mass, pleural effusion, or pneumothorax. No acute bony abnormalities are identified.  IMPRESSION: COPD/emphysema without evidence of acute cardiopulmonary disease.  Original Report Authenticated By: Rosendo Gros, M.D.   I reviewed the above chest x-ray myself and I agree with findings.  1. Bronchitis   2. COPD (chronic obstructive pulmonary disease)     Room air saturation is 99% which I interpret to be normal.   8:11 PM Patient has received 3 nebulizer treatments and reports feeling much improved. Chest x-ray showed no overt  pneumonia. His room air saturations are normal. My plan is to treat him for acute on chronic bronchitis associated with COPD. He reports that he does have inhalers at home that he use as an outpatient.  MDM   Patient has diminished breath sounds with diffuse wheezing scattered throughout. This is consistent with a COPD exacerbation. Given the change in sputum production with his chronic cough, we'll consider antibiotics as well. Also given oral steroids which patient has taken in the past. My plan is to give several series of nebulizer treatments and continue to monitor for clinical improvement.        Gavin Pound. Anaika Santillano, MD 10/03/11 2014

## 2011-10-03 NOTE — Discharge Instructions (Signed)
 Bronchitis Bronchitis is the body's way of reacting to injury and/or infection (inflammation) of the bronchi. Bronchi are the air tubes that extend from the windpipe into the lungs. If the inflammation becomes severe, it may cause shortness of breath. CAUSES  Inflammation may be caused by:  A virus.   Germs (bacteria).   Dust.   Allergens.   Pollutants and many other irritants.  The cells lining the bronchial tree are covered with tiny hairs (cilia). These constantly beat upward, away from the lungs, toward the mouth. This keeps the lungs free of pollutants. When these cells become too irritated and are unable to do their job, mucus begins to develop. This causes the characteristic cough of bronchitis. The cough clears the lungs when the cilia are unable to do their job. Without either of these protective mechanisms, the mucus would settle in the lungs. Then you would develop pneumonia. Smoking is a common cause of bronchitis and can contribute to pneumonia. Stopping this habit is the single most important thing you can do to help yourself. TREATMENT   Your caregiver may prescribe an antibiotic if the cough is caused by bacteria. Also, medicines that open up your airways make it easier to breathe. Your caregiver may also recommend or prescribe an expectorant. It will loosen the mucus to be coughed up. Only take over-the-counter or prescription medicines for pain, discomfort, or fever as directed by your caregiver.   Removing whatever causes the problem (smoking, for example) is critical to preventing the problem from getting worse.   Cough suppressants may be prescribed for relief of cough symptoms.   Inhaled medicines may be prescribed to help with symptoms now and to help prevent problems from returning.   For those with recurrent (chronic) bronchitis, there may be a need for steroid medicines.  SEEK IMMEDIATE MEDICAL CARE IF:   During treatment, you develop more pus-like mucus  (purulent sputum).   You have a fever.   Your baby is older than 3 months with a rectal temperature of 102 F (38.9 C) or higher.   Your baby is 109 months old or younger with a rectal temperature of 100.4 F (38 C) or higher.   You become progressively more ill.   You have increased difficulty breathing, wheezing, or shortness of breath.  It is necessary to seek immediate medical care if you are elderly or sick from any other disease. MAKE SURE YOU:   Understand these instructions.   Will watch your condition.   Will get help right away if you are not doing well or get worse.  Document Released: 02/17/2005 Document Revised: 02/06/2011 Document Reviewed: 12/28/2007 [facility] Patient Information 2012 [place], [place].    Chronic Obstructive Pulmonary Disease Chronic obstructive pulmonary disease (COPD) is a lung disease. The lungs become damaged, making it hard to get air in and out of your lungs. The damage to your lungs cannot be changed.  HOME CARE  Stop smoking if you smoke. Avoid secondhand smoke.   Only take medicine as told by your doctor.   Talk to your doctor about using cough syrup or over-the-counter medicines.   Drink enough fluids to keep your pee (urine) clear or pale yellow.   Use a humidifier or vaporizer. This may help loosen the thick spit (mucus).   Talk to your doctor about vaccines that help prevent other lung problems (pneumonia and flu vaccines).   Use home oxygen as told by your doctor.   Stay active and exercise.   Eat healthy  foods.  GET HELP RIGHT AWAY IF:   Your heart is beating fast.   You become disturbed, confused, shake, or are dazed.   You have trouble breathing.   You have chest pain.   You have a fever.   You cough up thick spit that is yellowish-white or green.   Your breathing becomes worse when you exercise.   You are running out of the medicine you take for your breathing.  MAKE SURE YOU:   Understand these  instructions.   Will watch your condition.   Will get help right away if you are not doing well or get worse.  Document Released: 08/06/2007 Document Revised: 02/06/2011 Document Reviewed: 04/19/2010 [facility] Patient Information 2012 [place], [place].

## 2011-10-03 NOTE — ED Notes (Signed)
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  He has had SHOB, cough and rib pain since Wednesday.

## 2011-10-03 NOTE — ED Notes (Addendum)
Error: Note charted on wrong time. Re-documented under correct time of 1905

## 2011-10-03 NOTE — ED Notes (Addendum)
BBS continue to be diminished. Patient just received his second treatment of Albuterol 5.0mg  and Atrovent 0.5 mg. Slight improvement in air movement was noted. Patient feels that he is breathing slightly better. Rt will continue to monitor and assess patient.

## 2011-10-03 NOTE — ED Notes (Addendum)
Symptoms began Wednesday. Pt believes he breathed in too much of the cleaner he was using to clean the stove. Hx of asthma. SOB and productive cough present.

## 2011-10-20 ENCOUNTER — Ambulatory Visit (INDEPENDENT_AMBULATORY_CARE_PROVIDER_SITE_OTHER): Payer: Self-pay | Admitting: Adult Health

## 2011-10-20 ENCOUNTER — Encounter: Payer: Self-pay | Admitting: Adult Health

## 2011-10-20 VITALS — BP 132/68 | HR 58 | Temp 97.4°F | Ht 73.0 in | Wt 204.0 lb

## 2011-10-20 DIAGNOSIS — J449 Chronic obstructive pulmonary disease, unspecified: Secondary | ICD-10-CM

## 2011-10-20 NOTE — Assessment & Plan Note (Signed)
Recent flare now resolved  Cont on current regimen.  follow up Dr. Delton Coombes  In 2 months and As needed   Samples given of inhalers.

## 2011-10-20 NOTE — Progress Notes (Signed)
61 hx current tobacco, COPD +/- asthma, HTN. Little other PMH. Has been maintained on Spiriva + Symbicort since 12/2008.  His PSG shows AHI 3, RDI 10.5 (done 01/02/10).   ROV 05/02/10 -- severe COPD, responsive to BD. He describes significant improvement in his symptoms since he moved to new location, with his sister. No longer w second hand smoke, no pets or dust exposure. No exacerbations since last time. Cough is better. Using SABA about 4x a week. Has to stop after about 40 yrds walking. He has stopped smoking altogether, has gained about 20 lbs.   ROV 08/19/10 - severe COPD with BD responsiveness, asthma. He presents today for a dedicated visit to assess functional capacity and to prepare medical statement. He tells me today that his breathing has done ok since last visit, no exacerbations. Using Spiriva + Symbicort + ventolin prn (~once a day).   ROV 10/10/10 -- severe COPD with BD responsiveness, asthma. Acute visit for worsening cough and nasal congestion, clear drainage. More SOB followed, no real wheeze. Started using SABA more frequently.  He is on lisinopril. He had exposure to some smoke last weekend that seemed to get him started. Cough is minimally productive, clear.   ROV 01/14/11 -- severe COPD with BD responsiveness, asthma. Not smoking currently, had a single flare since last time that was caused by perfume exposure, he used SABA and got better, did not require pred or abx. He has been coughing, again seems to be exposures to dust, etc. Maintained on Spiriva + Symbicort. Taking PPI reliably.   ROV 05/09/11 -- severe COPD with BD responsiveness, asthma. Has been having some difficulty getting His meds due to increased cost, still going to [facility]. No flares since last time. Many days goes without his ventoliin, some days needs 2x. Stable on Spiriva and Symbicort. Cough has been worse since he ran out of loratadine and nasacort.   ROV 08/18/11 -- severe COPD with BD responsiveness, asthma.  Also allergic rhinitis. Has been having more trouble breathing last few weeks, ? Due to the heat or to more pollen exposure.  Wheeze is stable, having more sneezing and cough. Has nasal steroid that he uses prn. Doesn't have any loratadine right now. No exacerbations since last time.   10/20/2011 ER follow up  Seen in ER on 10/03/11 for COPD flare, tx w/ Nebs and steroid taper.  CXR with no acute process.  Feels better w/ decreased cough and wheezing.  Back to his baseline  No fever, discolored mucus . No edema.  Remains on Symbicort and Spiriva Waiting on rx from [facility], he is being transferred to different facility as [facility] is closing.    ROS:  Constitutional:   No  weight loss, night sweats,  Fevers, chills, + fatigue, or  lassitude.  HEENT:   No headaches,  Difficulty swallowing,  Tooth/dental problems, or  Sore throat,                No sneezing, itching, ear ache, nasal congestion, post nasal drip,   CV:  No chest pain,  Orthopnea, PND, swelling in lower extremities, anasarca, dizziness, palpitations, syncope.   GI  No heartburn, indigestion, abdominal pain, nausea, vomiting, diarrhea, change in bowel habits, loss of appetite, bloody stools.   Resp:  .  No excess mucus, no productive cough,  No non-productive cough,  No coughing up of blood.  No change in color of mucus.  No wheezing.  No chest wall deformity  Skin: no rash or  lesions.  GU: no dysuria, change in color of urine, no urgency or frequency.  No flank pain, no hematuria   MS:  No joint pain or swelling.  No decreased range of motion.  No back pain.  Psych:  No change in mood or affect. No depression or anxiety.  No memory loss.       EXAM:   Gen: Pleasant, well-nourished, in no distress,  normal affect  ENT: No lesions,  mouth clear,  oropharynx clear, no postnasal drip  Neck: No JVD, no TMG, no carotid bruits  Lungs: diminished BS in bases   Cardiovascular: RRR, heart sounds normal, no murmur  or gallops, no peripheral edema  Musculoskeletal: No deformities, no cyanosis or clubbing  Neuro: alert, non focal  Skin: Warm, no lesions or rashes

## 2011-10-20 NOTE — Patient Instructions (Addendum)
Follow up with Dr. Delton Coombes  In 2 months and As needed   Continue on current regimen.

## 2011-11-04 ENCOUNTER — Encounter: Payer: Self-pay | Admitting: Neurosurgery

## 2011-11-05 ENCOUNTER — Ambulatory Visit: Payer: Self-pay | Admitting: Neurosurgery

## 2011-11-05 ENCOUNTER — Other Ambulatory Visit: Payer: Self-pay

## 2011-12-22 ENCOUNTER — Ambulatory Visit: Payer: Self-pay | Admitting: Emergency Medicine

## 2012-01-06 ENCOUNTER — Telehealth: Payer: Self-pay | Admitting: Emergency Medicine

## 2012-01-06 NOTE — Telephone Encounter (Signed)
I called pt to get more information. I am not famailiar with this pharmacy that he has provided so he is going to call back with their #.

## 2012-01-06 NOTE — Telephone Encounter (Signed)
Pt called and let me know that the phone number is [phone]. I have called the pharmacy several times and no one will answer the phone and I am not given an option to leave a message for a refill.  I called the pt and let him know. He would like [place] to try again tomorrow morning. Pt states that the pharmacy will not contact us about the refill, that we have to contact them.

## 2012-01-07 ENCOUNTER — Telehealth: Payer: Self-pay | Admitting: Emergency Medicine

## 2012-01-07 MED ORDER — ALBUTEROL SULFATE HFA 108 (90 BASE) MCG/ACT IN AERS: 2.0000 | INHALATION_SPRAY | Freq: Four times a day (QID) | RESPIRATORY_TRACT | Status: DC | PRN

## 2012-01-07 NOTE — Telephone Encounter (Signed)
Called [facility], spoke with [name] who reported that this has already been taken care of (see 11.5.13 phone note).  Nothing further needed; will sign off.

## 2012-01-07 NOTE — Telephone Encounter (Signed)
I called back to the pharmacy this morning and they weren't open yet but I could leave a message. I left the refill authorization on their voicemail. I will call the pt and let him know that this has been done.  When trying to call the pt I didn't get an answer and I couldn't leave a message.

## 2012-01-07 NOTE — Telephone Encounter (Signed)
Pt called back.  Stated that he spoke to his pharm and they stated they need [place] to call them back and clarify the rx. Leanora Ivanoff

## 2012-02-05 ENCOUNTER — Ambulatory Visit (INDEPENDENT_AMBULATORY_CARE_PROVIDER_SITE_OTHER): Payer: Self-pay | Admitting: Adult Health

## 2012-02-05 ENCOUNTER — Encounter: Payer: Self-pay | Admitting: Adult Health

## 2012-02-05 VITALS — BP 162/84 | HR 71 | Temp 96.8°F | Ht 73.0 in | Wt 191.8 lb

## 2012-02-05 DIAGNOSIS — J449 Chronic obstructive pulmonary disease, unspecified: Secondary | ICD-10-CM

## 2012-02-05 MED ORDER — PREDNISONE 10 MG PO TABS: ORAL_TABLET | ORAL | Status: DC

## 2012-02-05 NOTE — Progress Notes (Signed)
61 hx current tobacco, COPD +/- asthma, HTN. Little other PMH. Has been maintained on Spiriva + Symbicort since 12/2008.  His PSG shows AHI 3, RDI 10.5 (done 01/02/10).   ROV 05/02/10 -- severe COPD, responsive to BD. He describes significant improvement in his symptoms since he moved to new location, with his sister. No longer w second hand smoke, no pets or dust exposure. No exacerbations since last time. Cough is better. Using SABA about 4x a week. Has to stop after about 40 yrds walking. He has stopped smoking altogether, has gained about 20 lbs.   ROV 08/19/10 - severe COPD with BD responsiveness, asthma. He presents today for a dedicated visit to assess functional capacity and to prepare medical statement. He tells me today that his breathing has done ok since last visit, no exacerbations. Using Spiriva + Symbicort + ventolin prn (~once a day).   ROV 10/10/10 -- severe COPD with BD responsiveness, asthma. Acute visit for worsening cough and nasal congestion, clear drainage. More SOB followed, no real wheeze. Started using SABA more frequently.  He is on lisinopril. He had exposure to some smoke last weekend that seemed to get him started. Cough is minimally productive, clear.   ROV 01/14/11 -- severe COPD with BD responsiveness, asthma. Not smoking currently, had a single flare since last time that was caused by perfume exposure, he used SABA and got better, did not require pred or abx. He has been coughing, again seems to be exposures to dust, etc. Maintained on Spiriva + Symbicort. Taking PPI reliably.   ROV 05/09/11 -- severe COPD with BD responsiveness, asthma. Has been having some difficulty getting His meds due to increased cost, still going to [facility]. No flares since last time. Many days goes without his ventoliin, some days needs 2x. Stable on Spiriva and Symbicort. Cough has been worse since he ran out of loratadine and nasacort.   ROV 08/18/11 -- severe COPD with BD responsiveness, asthma.  Also allergic rhinitis. Has been having more trouble breathing last few weeks, ? Due to the heat or to more pollen exposure.  Wheeze is stable, having more sneezing and cough. Has nasal steroid that he uses prn. Doesn't have any loratadine right now. No exacerbations since last time.   10/20/11  ER follow up  Seen in ER on 10/03/11 for COPD flare, tx w/ Nebs and steroid taper.  CXR with no acute process.  Feels better w/ decreased cough and wheezing.  Back to his baseline  No fever, discolored mucus . No edema.  Remains on Symbicort and Spiriva Waiting on rx from [facility], he is being transferred to different facility as [facility] is closing.  >>  02/05/2012 Acute OV  Complains of increased dyspnea, wheezing , coughing w/ increased SABA use  Using SABA on average 5 times a day over last several weeks.  No fever, discolored mucus or hemotpysis  No missed doses of symbicort /spiriva.  Now followed at [facility] office  No drainage, cough is dry .      ROS:  Constitutional:   No  weight loss, night sweats,  Fevers, chills, + fatigue, or  lassitude.  HEENT:   No headaches,  Difficulty swallowing,  Tooth/dental problems, or  Sore throat,                No sneezing, itching, ear ache, nasal congestion, post nasal drip,   CV:  No chest pain,  Orthopnea, PND, swelling in lower extremities, anasarca, dizziness, palpitations, syncope.  GI  No heartburn, indigestion, abdominal pain, nausea, vomiting, diarrhea, change in bowel habits, loss of appetite, bloody stools.   Resp:  .  No excess mucus,    No coughing up of blood.  No change in color of mucus.  No chest wall deformity  Skin: no rash or lesions.  GU: no dysuria, change in color of urine, no urgency or frequency.  No flank pain, no hematuria   MS:  No joint pain or swelling.  No decreased range of motion.  No back pain.  Psych:  No change in mood or affect. No depression or anxiety.  No memory  loss.       EXAM:   Gen: Pleasant, well-nourished, in no distress,  normal affect  ENT: No lesions,  mouth clear,  oropharynx clear, no postnasal drip  Neck: No JVD, no TMG, no carotid bruits  Lungs: diminished BS in bases , faint exp wheeze on forced exp.   Cardiovascular: RRR, heart sounds normal, no murmur or gallops, no peripheral edema  Musculoskeletal: No deformities, no cyanosis or clubbing  Neuro: alert, non focal  Skin: Warm, no lesions or rashes

## 2012-02-05 NOTE — Patient Instructions (Addendum)
Prednisone taper over next week.  I will call with xray results.  follow up Dr. Delton Coombes  In 6 weeks  Please contact office for sooner follow up if symptoms do not improve or worsen or seek emergency care

## 2012-02-05 NOTE — Assessment & Plan Note (Signed)
Flare  Xray pending   Plan Prednisone taper over next week.  I will call with xray results.  follow up Dr. Delton Coombes  In 6 weeks  Please contact office for sooner follow up if symptoms do not improve or worsen or seek emergency care

## 2012-03-18 ENCOUNTER — Encounter: Payer: Self-pay | Admitting: Emergency Medicine

## 2012-03-18 ENCOUNTER — Ambulatory Visit (INDEPENDENT_AMBULATORY_CARE_PROVIDER_SITE_OTHER): Payer: No Typology Code available for payment source | Admitting: Emergency Medicine

## 2012-03-18 VITALS — BP 128/74 | HR 70 | Temp 97.6°F | Ht 73.0 in | Wt 189.8 lb

## 2012-03-18 DIAGNOSIS — J449 Chronic obstructive pulmonary disease, unspecified: Secondary | ICD-10-CM

## 2012-03-18 NOTE — Patient Instructions (Addendum)
Please continue your Spiriva and Symbicort Use your albuterol 2 puffs as needed Don't smoke any cigarettes! Follow with Dr Delton Coombes in 3 months or sooner if you have any problems.

## 2012-03-18 NOTE — Progress Notes (Signed)
61 hx current tobacco, COPD +/- asthma, HTN. Little other PMH. Has been maintained on Spiriva + Symbicort since 12/2008.  His PSG shows AHI 3, RDI 10.5 (done 01/02/10).   ROV 05/02/10 -- severe COPD, responsive to BD. He describes significant improvement in his symptoms since he moved to new location, with his sister. No longer w second hand smoke, no pets or dust exposure. No exacerbations since last time. Cough is better. Using SABA about 4x a week. Has to stop after about 40 yrds walking. He has stopped smoking altogether, has gained about 20 lbs.   ROV 08/19/10 - severe COPD with BD responsiveness, asthma. He presents today for a dedicated visit to assess functional capacity and to prepare medical statement. He tells me today that his breathing has done ok since last visit, no exacerbations. Using Spiriva + Symbicort + ventolin prn (~once a day).   ROV 10/10/10 -- severe COPD with BD responsiveness, asthma. Acute visit for worsening cough and nasal congestion, clear drainage. More SOB followed, no real wheeze. Started using SABA more frequently.  He is on lisinopril. He had exposure to some smoke last weekend that seemed to get him started. Cough is minimally productive, clear.   ROV 01/14/11 -- severe COPD with BD responsiveness, asthma. Not smoking currently, had a single flare since last time that was caused by perfume exposure, he used SABA and got better, did not require pred or abx. He has been coughing, again seems to be exposures to dust, etc. Maintained on Spiriva + Symbicort. Taking PPI reliably.   ROV 05/09/11 -- severe COPD with BD responsiveness, asthma. Has been having some difficulty getting His meds due to increased cost, still going to [facility]. No flares since last time. Many days goes without his ventoliin, some days needs 2x. Stable on Spiriva and Symbicort. Cough has been worse since he ran out of loratadine and nasacort.   ROV 08/18/11 -- severe COPD with BD responsiveness, asthma.  Also allergic rhinitis. Has been having more trouble breathing last few weeks, ? Due to the heat or to more pollen exposure.  Wheeze is stable, having more sneezing and cough. Has nasal steroid that he uses prn. Doesn't have any loratadine right now. No exacerbations since last time.   10/20/11  ER follow up  Seen in ER on 10/03/11 for COPD flare, tx w/ Nebs and steroid taper.  CXR with no acute process.  Feels better w/ decreased cough and wheezing.  Back to his baseline  No fever, discolored mucus . No edema.  Remains on Symbicort and Spiriva Waiting on rx from [facility], he is being transferred to different facility as [facility] is closing.  >>  Acute OV 02/05/12 Complains of increased dyspnea, wheezing , coughing w/ increased SABA use  Using SABA on average 5 times a day over last several weeks.  No fever, discolored mucus or hemotpysis  No missed doses of symbicort /spiriva.  Now followed at [facility] "[facility]" office  No drainage, cough is dry .   ROV 03/18/12 - severe COPD with BD responsiveness, asthma. He is slowly improving from recent bronchitis. Still with cough, clear mucous, thick. He is still on Symbicort + Spiriva. He is on proventil instead of ventolin. He is using SABA about several times a day. He tells me that he had a syncopal episode in November. He quit cigs a yr ago, still sneaks one every now and then  EXAM:  Filed Vitals:   03/18/12 1340  BP: 128/74  Pulse: 70  Temp: 97.6 F (36.4 C)    Gen: Pleasant, well-nourished, in no distress,  normal affect  ENT: No lesions,  mouth clear,  oropharynx clear, no postnasal drip  Neck: No JVD, no TMG, no carotid bruits  Lungs: diminished BS in bases , faint exp wheeze on forced exp.   Cardiovascular: RRR, heart sounds normal, no murmur or gallops, no peripheral edema  Musculoskeletal: No deformities, no cyanosis or clubbing  Neuro: alert, non focal  Skin: Warm, no lesions or rashes   COPD Bronchitis  resolved, seems at baseline. Still smokes every now and then.  - continue same BD's - discussed tobacco - rov 3 months

## 2012-03-18 NOTE — Assessment & Plan Note (Signed)
Bronchitis resolved, seems at baseline. Still smokes every now and then.  - continue same BD's - discussed tobacco - rov 3 months

## 2012-05-06 ENCOUNTER — Telehealth: Payer: Self-pay | Admitting: Emergency Medicine

## 2012-05-06 MED ORDER — TIOTROPIUM BROMIDE MONOHYDRATE 18 MCG IN CAPS: 18.0000 ug | ORAL_CAPSULE | Freq: Every day | RESPIRATORY_TRACT | Status: DC

## 2012-05-06 NOTE — Telephone Encounter (Signed)
PT notified that sample of Spiriva was left at front desk.

## 2012-06-28 ENCOUNTER — Encounter: Payer: Self-pay | Admitting: Emergency Medicine

## 2012-06-28 ENCOUNTER — Ambulatory Visit (INDEPENDENT_AMBULATORY_CARE_PROVIDER_SITE_OTHER): Payer: No Typology Code available for payment source | Admitting: Emergency Medicine

## 2012-06-28 VITALS — BP 110/72 | HR 72 | Temp 97.7°F | Ht 73.0 in | Wt 198.6 lb

## 2012-06-28 DIAGNOSIS — J449 Chronic obstructive pulmonary disease, unspecified: Secondary | ICD-10-CM

## 2012-06-28 NOTE — Assessment & Plan Note (Signed)
-   will attempt to get him a different cost effective pharmacy since he is having trouble getting meds on time - spiriva, restart symbicort - albuterol prn - ov 3 mon

## 2012-06-28 NOTE — Progress Notes (Signed)
62 hx current tobacco, COPD +/- asthma, HTN. Little other PMH. Has been maintained on Spiriva + Symbicort since 12/2008.  His PSG shows AHI 3, RDI 10.5 (done 01/02/10).   ROV 05/02/10 -- severe COPD, responsive to BD. He describes significant improvement in his symptoms since he moved to new location, with his sister. No longer w second hand smoke, no pets or dust exposure. No exacerbations since last time. Cough is better. Using SABA about 4x a week. Has to stop after about 40 yrds walking. He has stopped smoking altogether, has gained about 20 lbs.   ROV 08/19/10 - severe COPD with BD responsiveness, asthma. He presents today for a dedicated visit to assess functional capacity and to prepare medical statement. He tells me today that his breathing has done ok since last visit, no exacerbations. Using Spiriva + Symbicort + ventolin prn (~once a day).   ROV 10/10/10 -- severe COPD with BD responsiveness, asthma. Acute visit for worsening cough and nasal congestion, clear drainage. More SOB followed, no real wheeze. Started using SABA more frequently.  He is on lisinopril. He had exposure to some smoke last weekend that seemed to get him started. Cough is minimally productive, clear.   ROV 01/14/11 -- severe COPD with BD responsiveness, asthma. Not smoking currently, had a single flare since last time that was caused by perfume exposure, he used SABA and got better, did not require pred or abx. He has been coughing, again seems to be exposures to dust, etc. Maintained on Spiriva + Symbicort. Taking PPI reliably.   ROV 05/09/11 -- severe COPD with BD responsiveness, asthma. Has been having some difficulty getting His meds due to increased cost, still going to [facility]. No flares since last time. Many days goes without his ventoliin, some days needs 2x. Stable on Spiriva and Symbicort. Cough has been worse since he ran out of loratadine and nasacort.   ROV 08/18/11 -- severe COPD with BD responsiveness, asthma.  Also allergic rhinitis. Has been having more trouble breathing last few weeks, ? Due to the heat or to more pollen exposure.  Wheeze is stable, having more sneezing and cough. Has nasal steroid that he uses prn. Doesn't have any loratadine right now. No exacerbations since last time.   10/20/11  ER follow up  Seen in ER on 10/03/11 for COPD flare, tx w/ Nebs and steroid taper.  CXR with no acute process.  Feels better w/ decreased cough and wheezing.  Back to his baseline  No fever, discolored mucus . No edema.  Remains on Symbicort and Spiriva Waiting on rx from [facility], he is being transferred to different facility as [facility] is closing.  >>  Acute OV 02/05/12 Complains of increased dyspnea, wheezing , coughing w/ increased SABA use  Using SABA on average 5 times a day over last several weeks.  No fever, discolored mucus or hemotpysis  No missed doses of symbicort /spiriva.  Now followed at [facility]" office  No drainage, cough is dry .   ROV 03/18/12 - severe COPD with BD responsiveness, asthma. He is slowly improving from recent bronchitis. Still with cough, clear mucous, thick. He is still on Symbicort + Spiriva. He is on proventil instead of ventolin. He is using SABA about several times a day. He tells me that he had a syncopal episode in November. He quit cigs a yr ago, still sneaks one every now and then  ROV 06/28/12 -- f/u for severe COPD with BD responsiveness, asthma. He  returns stating that he is having trouble w getting meds at his current pharmacy - [facility]; he was at [facility]. He hasn't had his symbicort x 5 days. His ventolin was changed to proventil and he doesn't think it works as well. His allergies are stable, doesn't use nasacort regularly. He does have the spiriva. No AE since last time.  EXAM:  Filed Vitals:   06/28/12 1351  BP: 110/72  Pulse: 72  Temp: 97.7 F (36.5 C)    Gen: Pleasant, well-nourished,  in no distress,  normal affect  ENT: No lesions,  mouth clear,  oropharynx clear, no postnasal drip  Neck: No JVD, no TMG, no carotid bruits  Lungs: diminished BS in bases , faint exp wheeze on forced exp.   Cardiovascular: RRR, heart sounds normal, no murmur or gallops, no peripheral edema  Musculoskeletal: No deformities, no cyanosis or clubbing  Neuro: alert, non focal  Skin: Warm, no lesions or rashes   COPD - will attempt to get him a different cost effective pharmacy since he is having trouble getting meds on time - spiriva, restart symbicort - albuterol prn - ov 3 mon

## 2012-06-28 NOTE — Patient Instructions (Addendum)
Please continue your Spiriva daily Restart Symbicort twice a day Call our office if you are having getting your medications from your pharmacy. We may be able to help you until they are available Follow with Dr Delton Coombes in 3 months or sooner if you have any problems.

## 2012-07-09 ENCOUNTER — Telehealth: Payer: Self-pay | Admitting: Emergency Medicine

## 2012-07-09 MED ORDER — TIOTROPIUM BROMIDE MONOHYDRATE 18 MCG IN CAPS: 18.0000 ug | ORAL_CAPSULE | Freq: Every day | RESPIRATORY_TRACT | Status: DC

## 2012-07-09 MED ORDER — BUDESONIDE-FORMOTEROL FUMARATE 160-4.5 MCG/ACT IN AERO: 2.0000 | INHALATION_SPRAY | Freq: Two times a day (BID) | RESPIRATORY_TRACT | Status: DC

## 2012-07-09 NOTE — Telephone Encounter (Signed)
Forms have been signed w Rx and placed to be faxed. Spoke with pts sister and informed her of this. Nothing further needed

## 2012-07-09 NOTE — Telephone Encounter (Signed)
I have paperwork Will have Rb sign once in office

## 2012-08-30 ENCOUNTER — Telehealth: Payer: Self-pay | Admitting: Emergency Medicine

## 2012-08-30 MED ORDER — TIOTROPIUM BROMIDE MONOHYDRATE 18 MCG IN CAPS: 18.0000 ug | ORAL_CAPSULE | Freq: Every day | RESPIRATORY_TRACT | Status: DC

## 2012-08-30 MED ORDER — ALBUTEROL SULFATE HFA 108 (90 BASE) MCG/ACT IN AERS: 2.0000 | INHALATION_SPRAY | Freq: Four times a day (QID) | RESPIRATORY_TRACT | Status: DC | PRN

## 2012-08-30 NOTE — Telephone Encounter (Signed)
Pt aware RX's sent. Nothing further was needed 

## 2012-09-22 ENCOUNTER — Encounter: Payer: Self-pay | Admitting: Emergency Medicine

## 2012-09-22 ENCOUNTER — Ambulatory Visit (INDEPENDENT_AMBULATORY_CARE_PROVIDER_SITE_OTHER): Payer: Self-pay | Admitting: Emergency Medicine

## 2012-09-22 VITALS — BP 136/88 | HR 68 | Temp 98.0°F | Ht 73.0 in | Wt 203.4 lb

## 2012-09-22 DIAGNOSIS — J449 Chronic obstructive pulmonary disease, unspecified: Secondary | ICD-10-CM

## 2012-09-22 DIAGNOSIS — J309 Allergic rhinitis, unspecified: Secondary | ICD-10-CM

## 2012-09-22 DIAGNOSIS — J4489 Other specified chronic obstructive pulmonary disease: Secondary | ICD-10-CM

## 2012-09-22 NOTE — Assessment & Plan Note (Signed)
-   will give samples of spiriva, try to insure that his pharmacy comes through.  - symbicort - try mucinex

## 2012-09-22 NOTE — Patient Instructions (Addendum)
We will restart your spiriva and work on getting it through your pharmacy Continue your symbicort twice a day Continue loratadine and fluticasone nasal spray Try starting mucinex (or guaifenesin) 600mg  twice a day to thin your mucous.  Follow with Dr Delton Coombes in 3 months or sooner if you have any problems.

## 2012-09-22 NOTE — Progress Notes (Signed)
62 hx current tobacco, COPD +/- asthma, HTN. Little other PMH. Has been maintained on Spiriva + Symbicort since 12/2008.  His PSG shows AHI 3, RDI 10.5 (done 01/02/10).   ROV 05/02/10 -- severe COPD, responsive to BD. He describes significant improvement in his symptoms since he moved to new location, with his sister. No longer w second hand smoke, no pets or dust exposure. No exacerbations since last time. Cough is better. Using SABA about 4x a week. Has to stop after about 40 yrds walking. He has stopped smoking altogether, has gained about 20 lbs.   ROV 08/19/10 - severe COPD with BD responsiveness, asthma. He presents today for a dedicated visit to assess functional capacity and to prepare medical statement. He tells me today that his breathing has done ok since last visit, no exacerbations. Using Spiriva + Symbicort + ventolin prn (~once a day).   ROV 10/10/10 -- severe COPD with BD responsiveness, asthma. Acute visit for worsening cough and nasal congestion, clear drainage. More SOB followed, no real wheeze. Started using SABA more frequently.  He is on lisinopril. He had exposure to some smoke last weekend that seemed to get him started. Cough is minimally productive, clear.   ROV 01/14/11 -- severe COPD with BD responsiveness, asthma. Not smoking currently, had a single flare since last time that was caused by perfume exposure, he used SABA and got better, did not require pred or abx. He has been coughing, again seems to be exposures to dust, etc. Maintained on Spiriva + Symbicort. Taking PPI reliably.   ROV 05/09/11 -- severe COPD with BD responsiveness, asthma. Has been having some difficulty getting His meds due to increased cost, still going to [facility]. No flares since last time. Many days goes without his ventoliin, some days needs 2x. Stable on Spiriva and Symbicort. Cough has been worse since he ran out of loratadine and nasacort.   ROV 08/18/11 -- severe COPD with BD responsiveness, asthma.  Also allergic rhinitis. Has been having more trouble breathing last few weeks, ? Due to the heat or to more pollen exposure.  Wheeze is stable, having more sneezing and cough. Has nasal steroid that he uses prn. Doesn't have any loratadine right now. No exacerbations since last time.   10/20/11  ER follow up  Seen in ER on 10/03/11 for COPD flare, tx w/ Nebs and steroid taper.  CXR with no acute process.  Feels better w/ decreased cough and wheezing.  Back to his baseline  No fever, discolored mucus . No edema.  Remains on Symbicort and Spiriva Waiting on rx from [facility], he is being transferred to different facility as [facility] is closing.  >>  Acute OV 02/05/12 Complains of increased dyspnea, wheezing , coughing w/ increased SABA use  Using SABA on average 5 times a day over last several weeks.  No fever, discolored mucus or hemotpysis  No missed doses of symbicort /spiriva.  Now followed at [facility]" office  No drainage, cough is dry .   ROV 03/18/12 - severe COPD with BD responsiveness, asthma. He is slowly improving from recent bronchitis. Still with cough, clear mucous, thick. He is still on Symbicort + Spiriva. He is on proventil instead of ventolin. He is using SABA about several times a day. He tells me that he had a syncopal episode in November. He quit cigs a yr ago, still sneaks one every now and then  ROV 06/28/12 -- f/u for severe COPD with BD responsiveness, asthma. He  returns stating that he is having trouble w getting meds at his current pharmacy - [facility]; he was at [facility]. He hasn't had his symbicort x 5 days. His ventolin was changed to proventil and he doesn't think it works as well. His allergies are stable, doesn't use nasacort regularly. He does have the spiriva. No AE since last time.  ROV 09/22/12 -- severe COPD with BD responsiveness, asthma. He ran out of spiriva, has been going through financial assist  process. He is on the symbicort. He is having more mucous, more cough. Prod of clear, thick. No AE since last time.   EXAM:  Filed Vitals:   09/22/12 1010  BP: 136/88  Pulse: 68  Temp: 98 F (36.7 C)  TempSrc: Oral  Height: 6\' 1"  (1.854 m)  Weight: 203 lb 6.4 oz (92.262 kg)  SpO2: 98%   Gen: Pleasant, well-nourished, in no distress,  normal affect  ENT: No lesions,  mouth clear,  oropharynx clear, no postnasal drip  Neck: No JVD, no TMG, no carotid bruits  Lungs: diminished BS in bases , faint exp wheeze on forced exp.   Cardiovascular: RRR, heart sounds normal, no murmur or gallops, no peripheral edema  Musculoskeletal: No deformities, no cyanosis or clubbing  Neuro: alert, non focal  Skin: Warm, no lesions or rashes   COPD - will give samples of spiriva, try to insure that his pharmacy comes through.  - symbicort - try mucinex  ALLERGIC RHINITIS - continue loratadine and fluticasone/

## 2012-09-22 NOTE — Assessment & Plan Note (Signed)
-   continue loratadine and fluticasone/

## 2012-11-12 ENCOUNTER — Telehealth: Payer: Self-pay | Admitting: Emergency Medicine

## 2012-11-12 MED ORDER — BUDESONIDE-FORMOTEROL FUMARATE 160-4.5 MCG/ACT IN AERO: 2.0000 | INHALATION_SPRAY | Freq: Two times a day (BID) | RESPIRATORY_TRACT | Status: DC

## 2012-11-12 MED ORDER — TIOTROPIUM BROMIDE MONOHYDRATE 18 MCG IN CAPS: 18.0000 ug | ORAL_CAPSULE | Freq: Every day | RESPIRATORY_TRACT | Status: DC

## 2012-11-12 NOTE — Telephone Encounter (Signed)
Called and spoke with pt and he is aware of samples of the spiriva and the symbicort has been left up front for him to pick up. Nothing further is needed.

## 2012-11-17 ENCOUNTER — Encounter: Payer: Self-pay | Admitting: Adult Health

## 2012-11-17 ENCOUNTER — Telehealth: Payer: Self-pay | Admitting: Emergency Medicine

## 2012-11-17 ENCOUNTER — Ambulatory Visit (INDEPENDENT_AMBULATORY_CARE_PROVIDER_SITE_OTHER): Payer: Self-pay | Admitting: Adult Health

## 2012-11-17 VITALS — BP 112/70 | HR 78 | Ht 73.0 in | Wt 203.0 lb

## 2012-11-17 DIAGNOSIS — J449 Chronic obstructive pulmonary disease, unspecified: Secondary | ICD-10-CM

## 2012-11-17 MED ORDER — DOXYCYCLINE HYCLATE 100 MG PO TABS: 100.0000 mg | ORAL_TABLET | Freq: Two times a day (BID) | ORAL | Status: DC

## 2012-11-17 MED ORDER — LEVALBUTEROL HCL 0.63 MG/3ML IN NEBU
0.6300 mg | INHALATION_SOLUTION | Freq: Once | RESPIRATORY_TRACT | Status: AC
  Administered 2012-11-17: 0.63 mg via RESPIRATORY_TRACT

## 2012-11-17 MED ORDER — PREDNISONE 10 MG PO TABS: ORAL_TABLET | ORAL | Status: DC

## 2012-11-17 NOTE — Patient Instructions (Addendum)
Doxycycline 100mg  Twice daily  For 7 days  Prednisone taper over next week.  Continue on Symbicort and Spiriva  Please contact office for sooner follow up if symptoms do not improve or worsen or seek emergency care  Follow up Dr. Delton Coombes  In 6 weeks and As needed

## 2012-11-17 NOTE — Assessment & Plan Note (Signed)
Flare with URI   Plan  Doxycycline 100mg  Twice daily  For 7 days  Prednisone taper over next week.  Continue on Symbicort and Spiriva  Please contact office for sooner follow up if symptoms do not improve or worsen or seek emergency care  Follow up Dr. Delton Coombes  In 6 weeks and As needed

## 2012-11-17 NOTE — Telephone Encounter (Signed)
Called the pharmacy number above, was directed to pharmacist's voicemail  ??? LMOM [id number] x1.

## 2012-11-17 NOTE — Progress Notes (Signed)
62 hx former tobacco, COPD +/- asthma, HTN. Little other PMH. Has been maintained on Spiriva + Symbicort since 12/2008.  His PSG shows AHI 3, RDI 10.5 (done 01/02/10).   ROV 05/02/10 -- severe COPD, responsive to BD. He describes significant improvement in his symptoms since he moved to new location, with his sister. No longer w second hand smoke, no pets or dust exposure. No exacerbations since last time. Cough is better. Using SABA about 4x a week. Has to stop after about 40 yrds walking. He has stopped smoking altogether, has gained about 20 lbs.   ROV 08/19/10 - severe COPD with BD responsiveness, asthma. He presents today for a dedicated visit to assess functional capacity and to prepare medical statement. He tells me today that his breathing has done ok since last visit, no exacerbations. Using Spiriva + Symbicort + ventolin prn (~once a day).   ROV 10/10/10 -- severe COPD with BD responsiveness, asthma. Acute visit for worsening cough and nasal congestion, clear drainage. More SOB followed, no real wheeze. Started using SABA more frequently.  He is on lisinopril. He had exposure to some smoke last weekend that seemed to get him started. Cough is minimally productive, clear.   ROV 01/14/11 -- severe COPD with BD responsiveness, asthma. Not smoking currently, had a single flare since last time that was caused by perfume exposure, he used SABA and got better, did not require pred or abx. He has been coughing, again seems to be exposures to dust, etc. Maintained on Spiriva + Symbicort. Taking PPI reliably.   ROV 05/09/11 -- severe COPD with BD responsiveness, asthma. Has been having some difficulty getting His meds due to increased cost, still going to [facility]. No flares since last time. Many days goes without his ventoliin, some days needs 2x. Stable on Spiriva and Symbicort. Cough has been worse since he ran out of loratadine and nasacort.   ROV 08/18/11 -- severe COPD with BD responsiveness, asthma.  Also allergic rhinitis. Has been having more trouble breathing last few weeks, ? Due to the heat or to more pollen exposure.  Wheeze is stable, having more sneezing and cough. Has nasal steroid that he uses prn. Doesn't have any loratadine right now. No exacerbations since last time.   10/20/11  ER follow up  Seen in ER on 10/03/11 for COPD flare, tx w/ Nebs and steroid taper.  CXR with no acute process.  Feels better w/ decreased cough and wheezing.  Back to his baseline  No fever, discolored mucus . No edema.  Remains on Symbicort and Spiriva Waiting on rx from [facility], he is being transferred to different facility as [facility] is closing.  >>  Acute OV 02/05/12 Complains of increased dyspnea, wheezing , coughing w/ increased SABA use  Using SABA on average 5 times a day over last several weeks.  No fever, discolored mucus or hemotpysis  No missed doses of symbicort /spiriva.  Now followed at [facility]  No drainage, cough is dry .   ROV 03/18/12 - severe COPD with BD responsiveness, asthma. He is slowly improving from recent bronchitis. Still with cough, clear mucous, thick. He is still on Symbicort + Spiriva. He is on proventil instead of ventolin. He is using SABA about several times a day. He tells me that he had a syncopal episode in November. He quit cigs a yr ago, still sneaks one every now and then  ROV 06/28/12 -- f/u for severe COPD with BD responsiveness, asthma. He  returns stating that he is having trouble w getting meds at his current pharmacy - [facility]; he was at [facility]. He hasn't had his symbicort x 5 days. His ventolin was changed to proventil and he doesn't think it works as well. His allergies are stable, doesn't use nasacort regularly. He does have the spiriva. No AE since last time.  ROV 09/22/12 -- severe COPD with BD responsiveness, asthma. He ran out of spiriva, has been going through financial assist  process. He is on the symbicort. He is having more mucous, more cough. Prod of clear, thick. No AE since last time.  >>no changes    11/17/2012 Acute OV  Complains of Cough, congestion , drainage in throat for 1 week. Has thick dark white/yellow mucus , gets stuck in throat . Wakes up with drainage in thraot .  No fever, chest pain , orthopnea, leg swelling, n/v. No recent travel or abx use.    EXAM:  Filed Vitals:   11/17/12 1128  BP: 112/70  Pulse: 78  Height: 6\' 1"  (1.854 m)  Weight: 203 lb (92.08 kg)  SpO2: 94%   Gen: Pleasant, well-nourished, in no distress,  normal affect  ENT: No lesions,  mouth clear,  oropharynx clear, no postnasal drip  Neck: No JVD, no TMG, no carotid bruits  Lungs: diminished BS in bases , faint exp wheeze on forced exp.   Cardiovascular: RRR, heart sounds normal, no murmur or gallops, no peripheral edema  Musculoskeletal: No deformities, no cyanosis or clubbing  Neuro: alert, non focal  Skin: Warm, no lesions or rashes   No problem-specific assessment & plan notes found for this encounter.

## 2012-11-18 NOTE — Telephone Encounter (Addendum)
Patient wanting an update on pharmacy call. [phone]

## 2012-11-18 NOTE — Telephone Encounter (Signed)
Pharmacy returning call can be reached at [phone] opt 1.Alex Mccann

## 2012-11-18 NOTE — Telephone Encounter (Signed)
I spoke with the pharmacists and he wanted to clarify pred taper rx and doxy rx. I clarified, nothing further needed. Carron Curie, CMA

## 2012-11-18 NOTE — Telephone Encounter (Signed)
Pharmacy returning call

## 2012-11-22 MED ORDER — TIOTROPIUM BROMIDE MONOHYDRATE 18 MCG IN CAPS: 18.0000 ug | ORAL_CAPSULE | Freq: Every day | RESPIRATORY_TRACT | Status: DC

## 2012-11-22 MED ORDER — ALBUTEROL SULFATE HFA 108 (90 BASE) MCG/ACT IN AERS: 2.0000 | INHALATION_SPRAY | Freq: Four times a day (QID) | RESPIRATORY_TRACT | Status: DC | PRN

## 2012-11-22 MED ORDER — BUDESONIDE-FORMOTEROL FUMARATE 160-4.5 MCG/ACT IN AERO: 2.0000 | INHALATION_SPRAY | Freq: Two times a day (BID) | RESPIRATORY_TRACT | Status: DC

## 2012-11-22 NOTE — Addendum Note (Signed)
Addended by: Boone Master E on: 11/22/2012 01:00 PM   Modules accepted: Orders

## 2012-11-25 ENCOUNTER — Telehealth: Payer: Self-pay | Admitting: Adult Health

## 2012-11-25 NOTE — Telephone Encounter (Signed)
Pt had requested patient assistance forms at last ov w/ TP for symbicort, spiriva and ventolin hfa Forms completed with scripts and placed in the mail Would prefer if pt brought these back to the office to fax and file in pt's chart ATC pt to inform him of this, NA at home # with no option to LM Called cell #, spoke with patient and advised that the patient assistance forms are being placed in the mail and asked him to return them to fax and file in his chart Pt okay with this and verbalized his understanding. Nothing further needed; will sign off.

## 2012-12-20 ENCOUNTER — Telehealth: Payer: Self-pay | Admitting: Emergency Medicine

## 2012-12-20 NOTE — Telephone Encounter (Signed)
Error.Alex Mccann ° °

## 2012-12-20 NOTE — Telephone Encounter (Signed)
[name] returning call on pt can be reached at [phone] use opt 1.Alex Mccann

## 2012-12-20 NOTE — Telephone Encounter (Signed)
Pt is aware that rx has been sent in.  

## 2012-12-20 NOTE — Telephone Encounter (Signed)
Pt is requesting early refill on Albuterol inhaler.  Someone in his appt building is using Clorox to clean and it has been affecting his breathing and he is using his rescue inhaler more.  Instructed pt that we would ok refill but he needs to keep f/u appt with Dr Delton Coombes on 12/31/12.  [facility]  with [place] [facility] to ok early refill.

## 2012-12-31 ENCOUNTER — Ambulatory Visit (INDEPENDENT_AMBULATORY_CARE_PROVIDER_SITE_OTHER): Payer: Self-pay | Admitting: Emergency Medicine

## 2012-12-31 ENCOUNTER — Encounter: Payer: Self-pay | Admitting: Emergency Medicine

## 2012-12-31 VITALS — BP 136/84 | HR 78 | Ht 73.0 in | Wt 207.4 lb

## 2012-12-31 DIAGNOSIS — J449 Chronic obstructive pulmonary disease, unspecified: Secondary | ICD-10-CM

## 2012-12-31 NOTE — Progress Notes (Signed)
62 hx former tobacco, COPD +/- asthma, HTN. Little other PMH. Has been maintained on Spiriva + Symbicort since 12/2008.  His PSG shows AHI 3, RDI 10.5 (done 01/02/10).   ROV 05/02/10 -- severe COPD, responsive to BD. He describes significant improvement in his symptoms since he moved to new location, with his sister. No longer w second hand smoke, no pets or dust exposure. No exacerbations since last time. Cough is better. Using SABA about 4x a week. Has to stop after about 40 yrds walking. He has stopped smoking altogether, has gained about 20 lbs.   ROV 08/19/10 - severe COPD with BD responsiveness, asthma. He presents today for a dedicated visit to assess functional capacity and to prepare medical statement. He tells me today that his breathing has done ok since last visit, no exacerbations. Using Spiriva + Symbicort + ventolin prn (~once a day).   ROV 10/10/10 -- severe COPD with BD responsiveness, asthma. Acute visit for worsening cough and nasal congestion, clear drainage. More SOB followed, no real wheeze. Started using SABA more frequently.  He is on lisinopril. He had exposure to some smoke last weekend that seemed to get him started. Cough is minimally productive, clear.   ROV 01/14/11 -- severe COPD with BD responsiveness, asthma. Not smoking currently, had a single flare since last time that was caused by perfume exposure, he used SABA and got better, did not require pred or abx. He has been coughing, again seems to be exposures to dust, etc. Maintained on Spiriva + Symbicort. Taking PPI reliably.   ROV 05/09/11 -- severe COPD with BD responsiveness, asthma. Has been having some difficulty getting His meds due to increased cost, still going to [facility]. No flares since last time. Many days goes without his ventoliin, some days needs 2x. Stable on Spiriva and Symbicort. Cough has been worse since he ran out of loratadine and nasacort.   ROV 08/18/11 -- severe COPD with BD responsiveness, asthma.  Also allergic rhinitis. Has been having more trouble breathing last few weeks, ? Due to the heat or to more pollen exposure.  Wheeze is stable, having more sneezing and cough. Has nasal steroid that he uses prn. Doesn't have any loratadine right now. No exacerbations since last time.   10/20/11  ER follow up  Seen in ER on 10/03/11 for COPD flare, tx w/ Nebs and steroid taper.  CXR with no acute process.  Feels better w/ decreased cough and wheezing.  Back to his baseline  No fever, discolored mucus . No edema.  Remains on Symbicort and Spiriva Waiting on rx from [facility], he is being transferred to different facility as [facility] is closing.  >>  Acute OV 02/05/12 Complains of increased dyspnea, wheezing , coughing w/ increased SABA use  Using SABA on average 5 times a day over last several weeks.  No fever, discolored mucus or hemotpysis  No missed doses of symbicort /spiriva.  Now followed at [facility]" office  No drainage, cough is dry .   ROV 03/18/12 - severe COPD with BD responsiveness, asthma. He is slowly improving from recent bronchitis. Still with cough, clear mucous, thick. He is still on Symbicort + Spiriva. He is on proventil instead of ventolin. He is using SABA about several times a day. He tells me that he had a syncopal episode in November. He quit cigs a yr ago, still sneaks one every now and then  ROV 06/28/12 -- f/u for severe COPD with BD responsiveness, asthma. He  returns stating that he is having trouble w getting meds at his current pharmacy - [facility]; he was at [facility]. He hasn't had his symbicort x 5 days. His ventolin was changed to proventil and he doesn't think it works as well. His allergies are stable, doesn't use nasacort regularly. He does have the spiriva. No AE since last time.  ROV 09/22/12 -- severe COPD with BD responsiveness, asthma. He ran out of spiriva, has been going through financial assist  process. He is on the symbicort. He is having more mucous, more cough. Prod of clear, thick. No AE since last time.  >>no changes   Acute OV 11/17/12 Complains of Cough, congestion , drainage in throat for 1 week. Has thick dark white/yellow mucus , gets stuck in throat . Wakes up with drainage in thraot .  No fever, chest pain , orthopnea, leg swelling, n/v. No recent travel or abx use.   ROV 12/30/12 -- severe COPD with BD responsiveness, asthma. He had a URI 6 weeks ago, had an AE and was treated. He has been "passing out", now without a warning. Is being worked up by Neurology in [place], had MRI and carotid dopplers. He wonders if it relates to his new BP meds. He feels better from breathing standpoint although his overall exertional tolerance is down some.    EXAM:  Filed Vitals:   12/31/12 1438  BP: 136/84  Pulse: 78  Height: 6\' 1"  (1.854 m)  Weight: 207 lb 6.4 oz (94.076 kg)  SpO2: 95%   Gen: Pleasant, well-nourished, in no distress,  normal affect  ENT: No lesions,  mouth clear,  oropharynx clear, no postnasal drip  Neck: No JVD, no TMG, no carotid bruits  Lungs: diminished BS in bases , faint exp wheeze on forced exp.   Cardiovascular: RRR, heart sounds normal, no murmur or gallops, no peripheral edema  Musculoskeletal: No deformities, no cyanosis or clubbing  Neuro: alert, non focal  Skin: Warm, no lesions or rashes   COPD Please continue your inhaled mediations as you are taking them  Continue to follow with Dr Venetia Night to sort out why you are having loss-of-consciousness. Complete your neurological tests and discuss with them the possibility that it relates to your blood pressure medications.  Follow with Dr Delton Coombes in 3 months or sooner if you have any problems.

## 2012-12-31 NOTE — Assessment & Plan Note (Signed)
Please continue your inhaled mediations as you are taking them  Continue to follow with Dr Venetia Night to sort out why you are having loss-of-consciousness. Complete your neurological tests and discuss with them the possibility that it relates to your blood pressure medications.  Follow with Dr Delton Coombes in 3 months or sooner if you have any problems.

## 2012-12-31 NOTE — Patient Instructions (Signed)
Please continue your inhaled mediations as you are taking them  Continue to follow with Dr Amao to sort out why you are having loss-of-consciousness. Complete your neurological tests and discuss with them the possibility that it relates to your blood pressure medications.  Follow with Dr Zahir Eisenhour in 3 months or sooner if you have any problems. 

## 2013-03-08 ENCOUNTER — Other Ambulatory Visit: Payer: Self-pay | Admitting: Emergency Medicine

## 2013-04-20 ENCOUNTER — Other Ambulatory Visit: Payer: Self-pay | Admitting: Emergency Medicine

## 2013-05-30 ENCOUNTER — Ambulatory Visit: Payer: Self-pay | Admitting: Emergency Medicine

## 2013-06-15 ENCOUNTER — Ambulatory Visit (INDEPENDENT_AMBULATORY_CARE_PROVIDER_SITE_OTHER): Payer: No Typology Code available for payment source | Admitting: Emergency Medicine

## 2013-06-15 ENCOUNTER — Encounter: Payer: Self-pay | Admitting: Emergency Medicine

## 2013-06-15 VITALS — BP 128/88 | HR 78 | Ht 72.0 in | Wt 208.0 lb

## 2013-06-15 DIAGNOSIS — J449 Chronic obstructive pulmonary disease, unspecified: Secondary | ICD-10-CM

## 2013-06-15 DIAGNOSIS — J309 Allergic rhinitis, unspecified: Secondary | ICD-10-CM

## 2013-06-15 DIAGNOSIS — J4489 Other specified chronic obstructive pulmonary disease: Secondary | ICD-10-CM

## 2013-06-15 NOTE — Patient Instructions (Signed)
Please continue your Symbicort and Spiriva Continue your albuterol 2 puffs if needed for shortness of breath.  If your allergy symptoms return then please restart your loratadine (Claritin) 10mg  daily Follow with Dr Delton CoombesByrum in 3 months or sooner if you have any problems.

## 2013-06-15 NOTE — Assessment & Plan Note (Signed)
Please continue your Symbicort and Spiriva Continue your albuterol 2 puffs if needed for shortness of breath.  If your allergy symptoms return then please restart your loratadine (Claritin) 10mg daily Follow with Dr Delores Thelen in 3 months or sooner if you have any problems.  

## 2013-06-15 NOTE — Progress Notes (Signed)
63 hx former tobacco, COPD +/- asthma, HTN. Little other PMH. Has been maintained on Spiriva + Symbicort since 12/2008.  His PSG shows AHI 3, RDI 10.5 (done 01/02/10).   ROV 05/02/10 -- severe COPD, responsive to BD. He describes significant improvement in his symptoms since he moved to new location, with his sister. No longer w second hand smoke, no pets or dust exposure. No exacerbations since last time. Cough is better. Using SABA about 4x a week. Has to stop after about 40 yrds walking. He has stopped smoking altogether, has gained about 20 lbs.   ROV 08/19/10 - severe COPD with BD responsiveness, asthma. He presents today for a dedicated visit to assess functional capacity and to prepare medical statement. He tells me today that his breathing has done ok since last visit, no exacerbations. Using Spiriva + Symbicort + ventolin prn (~once a day).   ROV 10/10/10 -- severe COPD with BD responsiveness, asthma. Acute visit for worsening cough and nasal congestion, clear drainage. More SOB followed, no real wheeze. Started using SABA more frequently.  He is on lisinopril. He had exposure to some smoke last weekend that seemed to get him started. Cough is minimally productive, clear.   ROV 01/14/11 -- severe COPD with BD responsiveness, asthma. Not smoking currently, had a single flare since last time that was caused by perfume exposure, he used SABA and got better, did not require pred or abx. He has been coughing, again seems to be exposures to dust, etc. Maintained on Spiriva + Symbicort. Taking PPI reliably.   ROV 05/09/11 -- severe COPD with BD responsiveness, asthma. Has been having some difficulty getting His meds due to increased cost, still going to [facility]. No flares since last time. Many days goes without his ventoliin, some days needs 2x. Stable on Spiriva and Symbicort. Cough has been worse since he ran out of loratadine and nasacort.   ROV 08/18/11 -- severe COPD with BD responsiveness, asthma.  Also allergic rhinitis. Has been having more trouble breathing last few weeks, ? Due to the heat or to more pollen exposure.  Wheeze is stable, having more sneezing and cough. Has nasal steroid that he uses prn. Doesn't have any loratadine right now. No exacerbations since last time.   10/20/11  ER follow up  Seen in ER on 10/03/11 for COPD flare, tx w/ Nebs and steroid taper.  CXR with no acute process.  Feels better w/ decreased cough and wheezing.  Back to his baseline  No fever, discolored mucus . No edema.  Remains on Symbicort and Spiriva [facility], he is being transferred to different facility as [facility] is closing.  >>  Acute OV 02/05/12 Complains of increased dyspnea, wheezing , coughing w/ increased SABA use  Using SABA on average 5 times a day over last several weeks.  No fever, discolored mucus or hemotpysis  No missed doses of symbicort /spiriva.  [facility]  No drainage, cough is dry .   ROV 03/18/12 - severe COPD with BD responsiveness, asthma. He is slowly improving from recent bronchitis. Still with cough, clear mucous, thick. He is still on Symbicort + Spiriva. He is on proventil instead of ventolin. He is using SABA about several times a day. He tells me that he had a syncopal episode in November. He quit cigs a yr ago, still sneaks one every now and then  ROV 06/28/12 -- f/u for severe COPD with BD responsiveness, asthma. He  returns stating that he is having trouble w getting meds at his current pharmacy - [facility]; [facility] He hasn't had his symbicort x 5 days. His ventolin was changed to proventil and he doesn't think it works as well. His allergies are stable, doesn't use nasacort regularly. He does have the spiriva. No AE since last time.  ROV 09/22/12 -- severe COPD with BD responsiveness, asthma. He ran out of spiriva, has been going through financial assist  process. He is on the symbicort. He is having more mucous, more cough. Prod of clear, thick. No AE since last time.  >>no changes   Acute OV 11/17/12 Complains of Cough, congestion , drainage in throat for 1 week. Has thick dark white/yellow mucus , gets stuck in throat . Wakes up with drainage in thraot .  No fever, chest pain , orthopnea, leg swelling, n/v. No recent travel or abx use.   ROV 12/30/12 -- severe COPD with BD responsiveness, asthma. He had a URI 6 weeks ago, had an AE and was treated. He has been "passing out", now without a warning. Is being worked up by Neurology in [place], had MRI and carotid dopplers. He wonders if it relates to his new BP meds. He feels better from breathing standpoint although his overall exertional tolerance is down some.   ROV 06/15/13 -- severe COPD with BD responsiveness, asthma. He continues to have some episodes of syncope vs seizure activity, following w neurology. He hasn't had an episode since his BP medication was decreased. He reports breathing has been up and down - he has been exposed to some fumes, mold. He is on Symbicort and Spiriva. Not currently on allergy regimen. Uses SABA few times a week.    EXAM:  Filed Vitals:   06/15/13 1158  BP: 128/88  Pulse: 78  Height: 6' (1.829 m)  Weight: 208 lb (94.348 kg)  SpO2: 98%   Gen: Pleasant, well-nourished, in no distress,  normal affect  ENT: No lesions,  mouth clear,  oropharynx clear, no postnasal drip  Neck: No JVD, no TMG, no carotid bruits  Lungs: diminished BS in bases , faint exp wheeze on forced exp.   Cardiovascular: RRR, heart sounds normal, no murmur or gallops, no peripheral edema  Musculoskeletal: No deformities, no cyanosis or clubbing  Neuro: alert, non focal  Skin: Warm, no lesions or rashes   COPD Please continue your Symbicort and Spiriva Continue your albuterol 2 puffs if needed for shortness of breath.  If your allergy symptoms return then please restart your  loratadine (Claritin) 10mg  daily Follow with Dr Delton CoombesByrum in 3 months or sooner if you have any problems  ALLERGIC RHINITIS Not on therapy right now. Will restart if his loratadine and nasal steroid if he has worsening sx.

## 2013-06-15 NOTE — Assessment & Plan Note (Signed)
Not on therapy right now. Will restart if his loratadine and nasal steroid if he has worsening sx.

## 2013-07-05 ENCOUNTER — Telehealth: Payer: Self-pay | Admitting: Emergency Medicine

## 2013-07-05 NOTE — Telephone Encounter (Signed)
Disregard message. Pt will call back.

## 2013-08-05 ENCOUNTER — Telehealth: Payer: Self-pay | Admitting: Emergency Medicine

## 2013-08-05 MED ORDER — ALBUTEROL SULFATE HFA 108 (90 BASE) MCG/ACT IN AERS: 2.0000 | INHALATION_SPRAY | Freq: Four times a day (QID) | RESPIRATORY_TRACT | Status: DC | PRN

## 2013-08-05 NOTE — Telephone Encounter (Signed)
Pt requesting alternative for Proventil as insurance is not covering. Pt states he used to have Albuterol (ventolin) 108 Requests this be sent in as insurance will cover this  Sent to [facility]  Nothing further needed.

## 2013-10-21 ENCOUNTER — Encounter: Payer: Self-pay | Admitting: Emergency Medicine

## 2013-10-21 ENCOUNTER — Ambulatory Visit (INDEPENDENT_AMBULATORY_CARE_PROVIDER_SITE_OTHER): Payer: No Typology Code available for payment source | Admitting: Emergency Medicine

## 2013-10-21 ENCOUNTER — Other Ambulatory Visit: Payer: Self-pay

## 2013-10-21 VITALS — BP 128/74 | HR 69 | Ht 72.0 in | Wt 211.0 lb

## 2013-10-21 DIAGNOSIS — J449 Chronic obstructive pulmonary disease, unspecified: Secondary | ICD-10-CM

## 2013-10-21 MED ORDER — TIOTROPIUM BROMIDE MONOHYDRATE 18 MCG IN CAPS: 18.0000 ug | ORAL_CAPSULE | Freq: Every day | RESPIRATORY_TRACT | Status: DC

## 2013-10-21 MED ORDER — BUDESONIDE-FORMOTEROL FUMARATE 160-4.5 MCG/ACT IN AERO: 2.0000 | INHALATION_SPRAY | Freq: Two times a day (BID) | RESPIRATORY_TRACT | Status: DC

## 2013-10-21 MED ORDER — TRIAMCINOLONE ACETONIDE 55 MCG/ACT NA AERO: 2.0000 | INHALATION_SPRAY | Freq: Every day | NASAL | Status: DC

## 2013-10-21 MED ORDER — ALBUTEROL SULFATE HFA 108 (90 BASE) MCG/ACT IN AERS: 2.0000 | INHALATION_SPRAY | Freq: Four times a day (QID) | RESPIRATORY_TRACT | Status: DC | PRN

## 2013-10-21 NOTE — Progress Notes (Signed)
63 hx former tobacco, COPD +/- asthma, HTN. Little other PMH. Has been maintained on Spiriva + Symbicort since 12/2008.  His PSG shows AHI 3, RDI 10.5 (done 01/02/10).   ROV 05/02/10 -- severe COPD, responsive to BD. He describes significant improvement in his symptoms since he moved to new location, with his sister. No longer w second hand smoke, no pets or dust exposure. No exacerbations since last time. Cough is better. Using SABA about 4x a week. Has to stop after about 40 yrds walking. He has stopped smoking altogether, has gained about 20 lbs.   ROV 08/19/10 - severe COPD with BD responsiveness, asthma. He presents today for a dedicated visit to assess functional capacity and to prepare medical statement. He tells me today that his breathing has done ok since last visit, no exacerbations. Using Spiriva + Symbicort + ventolin prn (~once a day).   ROV 10/10/10 -- severe COPD with BD responsiveness, asthma. Acute visit for worsening cough and nasal congestion, clear drainage. More SOB followed, no real wheeze. Started using SABA more frequently.  He is on lisinopril. He had exposure to some smoke last weekend that seemed to get him started. Cough is minimally productive, clear.   ROV 01/14/11 -- severe COPD with BD responsiveness, asthma. Not smoking currently, had a single flare since last time that was caused by perfume exposure, he used SABA and got better, did not require pred or abx. He has been coughing, again seems to be exposures to dust, etc. Maintained on Spiriva + Symbicort. Taking PPI reliably.   ROV 05/09/11 -- severe COPD with BD responsiveness, asthma. Has been having some difficulty getting His meds due to increased cost, still going to [facility]. No flares since last time. Many days goes without his ventoliin, some days needs 2x. Stable on Spiriva and Symbicort. Cough has been worse since he ran out of loratadine and nasacort.   ROV 08/18/11 -- severe COPD with BD responsiveness, asthma.  Also allergic rhinitis. Has been having more trouble breathing last few weeks, ? Due to the heat or to more pollen exposure.  Wheeze is stable, having more sneezing and cough. Has nasal steroid that he uses prn. Doesn't have any loratadine right now. No exacerbations since last time.   10/20/11  ER follow up  Seen in ER on 10/03/11 for COPD flare, tx w/ Nebs and steroid taper.  CXR with no acute process.  Feels better w/ decreased cough and wheezing.  Back to his baseline  No fever, discolored mucus . No edema.  Remains on Symbicort and Spiriva [facility], he is being transferred to different facility as [facility] is closing.  >>  Acute OV 02/05/12 Complains of increased dyspnea, wheezing , coughing w/ increased SABA use  Using SABA on average 5 times a day over last several weeks.  No fever, discolored mucus or hemotpysis  No missed doses of symbicort /spiriva.  [facility]  No drainage, cough is dry .   ROV 03/18/12 - severe COPD with BD responsiveness, asthma. He is slowly improving from recent bronchitis. Still with cough, clear mucous, thick. He is still on Symbicort + Spiriva. He is on proventil instead of ventolin. He is using SABA about several times a day. He tells me that he had a syncopal episode in November. He quit cigs a yr ago, still sneaks one every now and then  ROV 06/28/12 -- f/u for severe COPD with BD responsiveness, asthma. He  returns stating that he is having trouble w getting meds at his current pharmacy - [facility]; [facility] He hasn't had his symbicort x 5 days. His ventolin was changed to proventil and he doesn't think it works as well. His allergies are stable, doesn't use nasacort regularly. He does have the spiriva. No AE since last time.  ROV 09/22/12 -- severe COPD with BD responsiveness, asthma. He ran out of spiriva, has been going through financial assist  process. He is on the symbicort. He is having more mucous, more cough. Prod of clear, thick. No AE since last time.  >>no changes   Acute OV 11/17/12 Complains of Cough, congestion , drainage in throat for 1 week. Has thick dark white/yellow mucus , gets stuck in throat . Wakes up with drainage in thraot .  No fever, chest pain , orthopnea, leg swelling, n/v. No recent travel or abx use.   ROV 12/30/12 -- severe COPD with BD responsiveness, asthma. He had a URI 6 weeks ago, had an AE and was treated. He has been "passing out", now without a warning. Is being worked up by Neurology in [place], had MRI and carotid dopplers. He wonders if it relates to his new BP meds. He feels better from breathing standpoint although his overall exertional tolerance is down some.   ROV 06/15/13 -- severe COPD with BD responsiveness, asthma. He continues to have some episodes of syncope vs seizure activity, following w neurology. He hasn't had an episode since his BP medication was decreased. He reports breathing has been up and down - he has been exposed to some fumes, mold. He is on Symbicort and Spiriva. Not currently on allergy regimen. Uses SABA few times a week.   ROV 10/21/13 -- severe COPD with BD responsiveness, asthma. He has been having syncope - relates it to his BP regimen. He hasn't had a spell since his BP medication was decreased. He is having R knee and L shoulder pain, limits his activity. His breathing has been doing well. He has gained weight. No flares since last visit. He smokes very rarely.    EXAM:  Filed Vitals:   10/21/13 1141  BP: 128/74  Pulse: 69  Height: 6' (1.829 m)  Weight: 211 lb (95.709 kg)  SpO2: 98%   Gen: Pleasant, well-nourished, in no distress,  normal affect  ENT: No lesions,  mouth clear,  oropharynx clear, no postnasal drip  Neck: No JVD, no TMG, no carotid bruits  Lungs: diminished BS in bases , faint exp wheeze on forced exp.   Cardiovascular: RRR, heart sounds normal,  no murmur or gallops, no peripheral edema  Musculoskeletal: No deformities, no cyanosis or clubbing  Neuro: alert, non focal  Skin: Warm, no lesions or rashes   COPD - stable on current regimen - discussed smoking cessation - rov 4

## 2013-10-21 NOTE — Patient Instructions (Signed)
Work hard on stopping smoking altogether Continue Symbicort and Spiriva Start nasacort 2 sprays once a day Follow with Dr Delton CoombesByrum in 4 months or sooner if you have any problems.

## 2013-10-21 NOTE — Assessment & Plan Note (Signed)
-   stable on current regimen - discussed smoking cessation - rov 4

## 2013-11-18 ENCOUNTER — Telehealth: Payer: Self-pay | Admitting: Emergency Medicine

## 2013-11-18 MED ORDER — TRIAMCINOLONE ACETONIDE 55 MCG/ACT NA AERO: 2.0000 | INHALATION_SPRAY | Freq: Every day | NASAL | Status: DC

## 2013-11-18 MED ORDER — TIOTROPIUM BROMIDE MONOHYDRATE 18 MCG IN CAPS: 18.0000 ug | ORAL_CAPSULE | Freq: Every day | RESPIRATORY_TRACT | Status: DC

## 2013-11-18 MED ORDER — BUDESONIDE-FORMOTEROL FUMARATE 160-4.5 MCG/ACT IN AERO: 2.0000 | INHALATION_SPRAY | Freq: Two times a day (BID) | RESPIRATORY_TRACT | Status: DC

## 2013-11-18 NOTE — Telephone Encounter (Signed)
Spoke with the pt and vefills  Rxs were sent

## 2013-11-18 NOTE — Telephone Encounter (Signed)
Called and pharm was already closed  [facility] next wk

## 2013-11-21 NOTE — Telephone Encounter (Signed)
Ok thanks 

## 2013-11-21 NOTE — Telephone Encounter (Signed)
RB the pharmacy called about the nasocort--this is OTC.  The pharmacy stated that the pts PCP sent in fluticasone for the pt so they will go ahead and fill this for the pt.  Will forward to RB to make him aware.

## 2014-01-02 ENCOUNTER — Telehealth: Payer: Self-pay | Admitting: Emergency Medicine

## 2014-01-02 MED ORDER — ALBUTEROL SULFATE HFA 108 (90 BASE) MCG/ACT IN AERS
2.0000 | INHALATION_SPRAY | Freq: Four times a day (QID) | RESPIRATORY_TRACT | Status: DC | PRN
Start: 2014-01-02 — End: 2014-02-09

## 2014-01-02 MED ORDER — TIOTROPIUM BROMIDE MONOHYDRATE 18 MCG IN CAPS: 18.0000 ug | ORAL_CAPSULE | Freq: Every day | RESPIRATORY_TRACT | Status: DC

## 2014-01-02 MED ORDER — BUDESONIDE-FORMOTEROL FUMARATE 160-4.5 MCG/ACT IN AERO: 2.0000 | INHALATION_SPRAY | Freq: Two times a day (BID) | RESPIRATORY_TRACT | Status: DC

## 2014-01-02 NOTE — Telephone Encounter (Signed)
Spoke with patient-states the [facility] and needs rx's sent to [facility] [address] [place], [place]. Pt is aware that I have sent refills for Symbicort, Spiriva, and Ventolin. Nasacort is OTC now. Nothing more needed at this time.

## 2014-02-09 ENCOUNTER — Other Ambulatory Visit: Payer: Self-pay | Admitting: *Deleted

## 2014-02-09 MED ORDER — ALBUTEROL SULFATE HFA 108 (90 BASE) MCG/ACT IN AERS: 2.0000 | INHALATION_SPRAY | Freq: Four times a day (QID) | RESPIRATORY_TRACT | Status: DC | PRN

## 2014-02-09 MED ORDER — TIOTROPIUM BROMIDE MONOHYDRATE 18 MCG IN CAPS: 18.0000 ug | ORAL_CAPSULE | Freq: Every day | RESPIRATORY_TRACT | Status: DC

## 2014-03-20 ENCOUNTER — Ambulatory Visit: Payer: No Typology Code available for payment source | Admitting: Emergency Medicine

## 2014-03-21 ENCOUNTER — Ambulatory Visit: Payer: No Typology Code available for payment source | Admitting: Emergency Medicine

## 2014-04-10 ENCOUNTER — Telehealth: Payer: Self-pay | Admitting: Emergency Medicine

## 2014-04-10 NOTE — Telephone Encounter (Signed)
We received a fax from pt's pharmacy stating that his insurance will no longer cover Spiriva. Alternatives are [place]za or Incruse.  Per RB - change to Tudorza 1 puff BID.  Left message for to call back to make him aware.

## 2014-04-11 MED ORDER — ACLIDINIUM BROMIDE 400 MCG/ACT IN AEPB: 1.0000 | INHALATION_SPRAY | Freq: Two times a day (BID) | RESPIRATORY_TRACT | Status: DC

## 2014-04-11 NOTE — Telephone Encounter (Signed)
Tudorza sent to pharmacy.  Per [name], pt is aware that this is being sent.  Nothing further needed.

## 2014-04-13 ENCOUNTER — Telehealth: Payer: Self-pay | Admitting: Pulmonary Disease

## 2014-04-13 NOTE — Telephone Encounter (Signed)
PA initiated for Symbicort 160/4.275mcg inhaler Patient ID# [id number] PA is being sent over to Clinical Review - determination within 72 hours.  Will send to [place] to follow up on.

## 2014-04-14 NOTE — Telephone Encounter (Signed)
Symbicort has been denied by pt's insurance. Pt will have to try and fail Advair and/or Breo.  RB - please advise which medication you would like to change the pt to.

## 2014-04-17 NOTE — Telephone Encounter (Signed)
I can't recall if he has ever taken / failed Advair. We can start breo once a day, see if he tolerates and what the cost will be.

## 2014-04-18 NOTE — Telephone Encounter (Signed)
lmtcb x1 

## 2014-04-20 NOTE — Telephone Encounter (Signed)
lmtcb x2 

## 2014-04-25 MED ORDER — FLUTICASONE FUROATE-VILANTEROL 100-25 MCG/INH IN AEPB
1.0000 | INHALATION_SPRAY | Freq: Every day | RESPIRATORY_TRACT | Status: DC
Start: 2014-04-25 — End: 2014-11-16

## 2014-04-25 NOTE — Telephone Encounter (Signed)
Rx has been sent in. Pt is aware of medication change. Nothing further was needed.

## 2014-05-18 ENCOUNTER — Ambulatory Visit: Payer: No Typology Code available for payment source | Admitting: Emergency Medicine

## 2014-05-18 ENCOUNTER — Telehealth: Payer: Self-pay | Admitting: Emergency Medicine

## 2014-05-18 NOTE — Telephone Encounter (Signed)
Pt sister cancelled appt today because she thought that he did not have transportation. Pt did have transportation and was planning on coming today. Aware that spot is now filled and that no other physicians can work patient in today to be seen. Pt was rescheduled to 05/19/14 at 2:45 with RB.  Nothing further needed.

## 2014-05-19 ENCOUNTER — Ambulatory Visit: Payer: 59 | Admitting: Emergency Medicine

## 2014-06-09 ENCOUNTER — Ambulatory Visit (INDEPENDENT_AMBULATORY_CARE_PROVIDER_SITE_OTHER): Payer: 59 | Admitting: Emergency Medicine

## 2014-06-09 ENCOUNTER — Encounter: Payer: Self-pay | Admitting: Emergency Medicine

## 2014-06-09 VITALS — BP 150/90 | HR 72 | Ht 72.0 in | Wt 209.0 lb

## 2014-06-09 DIAGNOSIS — J449 Chronic obstructive pulmonary disease, unspecified: Secondary | ICD-10-CM

## 2014-06-09 NOTE — Progress Notes (Signed)
64 hx former tobacco, COPD +/- asthma, HTN. Little other PMH. Has been maintained on Spiriva + Symbicort since 12/2008.  His PSG shows AHI 3, RDI 10.5 (done 01/02/10).   ROV 05/02/10 -- severe COPD, responsive to BD. He describes significant improvement in his symptoms since he moved to new location, with his sister. No longer w second hand smoke, no pets or dust exposure. No exacerbations since last time. Cough is better. Using SABA about 4x a week. Has to stop after about 40 yrds walking. He has stopped smoking altogether, has gained about 20 lbs.   ROV 08/19/10 - severe COPD with BD responsiveness, asthma. He presents today for a dedicated visit to assess functional capacity and to prepare medical statement. He tells me today that his breathing has done ok since last visit, no exacerbations. Using Spiriva + Symbicort + ventolin prn (~once a day).   ROV 10/10/10 -- severe COPD with BD responsiveness, asthma. Acute visit for worsening cough and nasal congestion, clear drainage. More SOB followed, no real wheeze. Started using SABA more frequently.  He is on lisinopril. He had exposure to some smoke last weekend that seemed to get him started. Cough is minimally productive, clear.   ROV 01/14/11 -- severe COPD with BD responsiveness, asthma. Not smoking currently, had a single flare since last time that was caused by perfume exposure, he used SABA and got better, did not require pred or abx. He has been coughing, again seems to be exposures to dust, etc. Maintained on Spiriva + Symbicort. Taking PPI reliably.   ROV 05/09/11 -- severe COPD with BD responsiveness, asthma. Has been having some difficulty getting His meds due to increased cost, still going to [facility]. No flares since last time. Many days goes without his ventoliin, some days needs 2x. Stable on Spiriva and Symbicort. Cough has been worse since he ran out of loratadine and nasacort.   ROV 08/18/11 -- severe COPD with BD responsiveness, asthma.  Also allergic rhinitis. Has been having more trouble breathing last few weeks, ? Due to the heat or to more pollen exposure.  Wheeze is stable, having more sneezing and cough. Has nasal steroid that he uses prn. Doesn't have any loratadine right now. No exacerbations since last time.   10/20/11  ER follow up  Seen in ER on 10/03/11 for COPD flare, tx w/ Nebs and steroid taper.  CXR with no acute process.  Feels better w/ decreased cough and wheezing.  Back to his baseline  No fever, discolored mucus . No edema.  Remains on Symbicort and Spiriva Waiting on rx from [facility], he is being transferred to different facility as [facility] is closing.  >>  Acute OV 02/05/12 Complains of increased dyspnea, wheezing , coughing w/ increased SABA use  Using SABA on average 5 times a day over last several weeks.  No fever, discolored mucus or hemotpysis  No missed doses of symbicort /spiriva.  [facility] followed [facility]  No drainage, cough is dry .   ROV 03/18/12 - severe COPD with BD responsiveness, asthma. He is slowly improving from recent bronchitis. Still with cough, clear mucous, thick. He is still on Symbicort + Spiriva. He is on proventil instead of ventolin. He is using SABA about several times a day. He tells me that he had a syncopal episode in November. He quit cigs a yr ago, still sneaks one every now and then  ROV 06/28/12 -- f/u for severe COPD with BD responsiveness, asthma. He  returns stating that he is having trouble w getting meds at his current pharmacy - [facility]; [facility]. He hasn't had his symbicort x 5 days. His ventolin was changed to proventil and he doesn't think it works as well. His allergies are stable, doesn't use nasacort regularly. He does have the spiriva. No AE since last time.  ROV 09/22/12 -- severe COPD with BD responsiveness, asthma. He ran out of spiriva, has been going through financial assist  process. He is on the symbicort. He is having more mucous, more cough. Prod of clear, thick. No AE since last time.  >>no changes   Acute OV 11/17/12 Complains of Cough, congestion , drainage in throat for 1 week. Has thick dark white/yellow mucus , gets stuck in throat . Wakes up with drainage in thraot .  No fever, chest pain , orthopnea, leg swelling, n/v. No recent travel or abx use.   ROV 12/30/12 -- severe COPD with BD responsiveness, asthma. He had a URI 6 weeks ago, had an AE and was treated. He has been "passing out", now without a warning. Is being worked up by Neurology in [place], had MRI and carotid dopplers. He wonders if it relates to his new BP meds. He feels better from breathing standpoint although his overall exertional tolerance is down some.   ROV 06/15/13 -- severe COPD with BD responsiveness, asthma. He continues to have some episodes of syncope vs seizure activity, following w neurology. He hasn't had an episode since his BP medication was decreased. He reports breathing has been up and down - he has been exposed to some fumes, mold. He is on Symbicort and Spiriva. Not currently on allergy regimen. Uses SABA few times a week.   ROV 10/21/13 -- severe COPD with BD responsiveness, asthma. He has been having syncope - relates it to his BP regimen. He hasn't had a spell since his BP medication was decreased. He is having R knee and L shoulder pain, limits his activity. His breathing has been doing well. He has gained weight. No flares since last visit. He smokes very rarely.   ROV 06/09/14 -- follow-up for severe COPD and asthmatic component.  He has been doing fairly well, ius having trouble getting his BP medication titrated. He is now working w Dr Vickki MuffWeston on this. He is now on [place]o and Tudorza.  No flares since last time. He is not requiring ventolin frequently. He restarted nasacort for his allergies this week, is not currently on loratadine. He is still smoking some - about 3 times a week.     EXAMCeasar Mons:  Filed Vitals:   06/09/14 1340 06/09/14 1341  BP:  150/90  Pulse:  72  Height: 6' (1.829 m)   Weight: 209 lb (94.802 kg)   SpO2:  92%   Gen: Pleasant, well-nourished, in no distress,  normal affect  ENT: No lesions,  mouth clear,  oropharynx clear, no postnasal drip  Neck: No JVD, no TMG, no carotid bruits  Lungs: diminished BS in bases , faint exp wheeze on forced exp.   Cardiovascular: RRR, heart sounds normal, no murmur or gallops, no peripheral edema  Musculoskeletal: No deformities, no cyanosis or clubbing  Neuro: alert, non focal  Skin: Warm, no lesions or rashes   COPD (chronic obstructive pulmonary disease) He has been doing well but he does continue to smoke several times a week. We spent most of our time discussing this including strategies to help  him stop. He is going to call me when he is ready to set a quit date and we will consider supple mental nicotine or other possible medications. We'll also consider referring him to support group classes.

## 2014-06-09 NOTE — Patient Instructions (Signed)
Please continue your Virgel BouquetBreo and [place]udorza as you have been taking them  Work hard on stopping smoking completely. Let us help you if we can. There are resources that we can pursue to help.  Follow with Dr Delton CoombesByrum in 6 months or sooner if you have any problems

## 2014-06-09 NOTE — Assessment & Plan Note (Signed)
He has been doing well but he does continue to smoke several times a week. We spent most of our time discussing this including strategies to help him stop. He is going to call me when he is ready to set a quit date and we will consider supple mental nicotine or other possible medications. We'll also consider referring him to support group classes.

## 2014-10-06 ENCOUNTER — Other Ambulatory Visit: Payer: Self-pay | Admitting: Emergency Medicine

## 2014-10-06 NOTE — Telephone Encounter (Signed)
PA initiated through [email or web address] for Alex Mccann [id number] Will receive response electronically in 24-72 hours Awaiting response

## 2014-10-11 NOTE — Telephone Encounter (Signed)
Noted  

## 2014-10-11 NOTE — Telephone Encounter (Signed)
Received response from PA stating that PA not required. Called patient to see if patient has received medication.  lmtcb.

## 2014-10-11 NOTE — Telephone Encounter (Signed)
Patient calling to let us know he did get his medication.

## 2014-11-16 ENCOUNTER — Other Ambulatory Visit: Payer: Self-pay | Admitting: Emergency Medicine

## 2014-11-28 ENCOUNTER — Telehealth: Payer: Self-pay | Admitting: *Deleted

## 2014-11-28 NOTE — Telephone Encounter (Signed)
Initiated PA for [place] thru Cover My Meds. Key: [id number]  Sent for review. Will await response.

## 2014-11-29 NOTE — Telephone Encounter (Signed)
PA was cancelled by [facility] due to refill request was to soon. PA was not required. Medication can be refilled on 12/01/14. Pharmacy informed. Nothing further needed.

## 2015-01-16 ENCOUNTER — Ambulatory Visit: Payer: 59 | Admitting: Emergency Medicine

## 2015-03-15 ENCOUNTER — Ambulatory Visit (INDEPENDENT_AMBULATORY_CARE_PROVIDER_SITE_OTHER): Payer: BLUE CROSS/BLUE SHIELD | Admitting: Emergency Medicine

## 2015-03-15 ENCOUNTER — Encounter: Payer: Self-pay | Admitting: Emergency Medicine

## 2015-03-15 VITALS — BP 116/76 | HR 73 | Ht 73.0 in | Wt 215.0 lb

## 2015-03-15 DIAGNOSIS — J449 Chronic obstructive pulmonary disease, unspecified: Secondary | ICD-10-CM | POA: Diagnosis not present

## 2015-03-15 MED ORDER — ALBUTEROL SULFATE HFA 108 (90 BASE) MCG/ACT IN AERS: 2.0000 | INHALATION_SPRAY | Freq: Four times a day (QID) | RESPIRATORY_TRACT | Status: DC | PRN

## 2015-03-15 NOTE — Patient Instructions (Addendum)
Please continue your tudorza twice a day  We will continue  Breo daily for now.  We will consider changing your inhaled medications after your insurance changes in March, depending on which meds they cover.  Use albuterol as needed for shortness of breath.  Follow with Dr Delton CoombesByrum in 4 months or sooner if you have any problems.

## 2015-03-15 NOTE — Assessment & Plan Note (Signed)
Appears to be stable at this time. He is probably having throat irritation from the breo, may benefit from a change - possibly ANoro or stiolto in place of both breo and [place]za. His insurance will change in March, will wait until after this to order an alternative based on new formulary

## 2015-03-15 NOTE — Addendum Note (Signed)
Addended by: Jaynee EaglesLEMONS, Hoda Hon C on: 03/15/2015 03:08 PM   Modules accepted: Orders

## 2015-03-15 NOTE — Progress Notes (Signed)
64 hx former tobacco, COPD +/- asthma, HTN. Little other PMH. Has been maintained on Spiriva + Symbicort since 12/2008.  His PSG shows AHI 3, RDI 10.5 (done 01/02/10).     ROV 12/30/12 -- severe COPD with BD responsiveness, asthma. He had a URI 6 weeks ago, had an AE and was treated. He has been "passing out", now without a warning. Is being worked up by Neurology in [place], had MRI and carotid dopplers. He wonders if it relates to his new BP meds. He feels better from breathing standpoint although his overall exertional tolerance is down some.   ROV 06/15/13 -- severe COPD with BD responsiveness, asthma. He continues to have some episodes of syncope vs seizure activity, following w neurology. He hasn't had an episode since his BP medication was decreased. He reports breathing has been up and down - he has been exposed to some fumes, mold. He is on Symbicort and Spiriva. Not currently on allergy regimen. Uses SABA few times a week.   ROV 10/21/13 -- severe COPD with BD responsiveness, asthma. He has been having syncope - relates it to his BP regimen. He hasn't had a spell since his BP medication was decreased. He is having R knee and L shoulder pain, limits his activity. His breathing has been doing well. He has gained weight. No flares since last visit. He smokes very rarely.   ROV 06/09/14 -- follow-up for severe COPD and asthmatic component.  He has been doing fairly well, ius having trouble getting his BP medication titrated. He is now working w Dr Vickki MuffWeston on this. He is now on [place]o and Tudorza.  No flares since last time. He is not requiring ventolin frequently. He restarted nasacort for his allergies this week, is not currently on loratadine. He is still smoking some - about 3 times a week.   ROV 03/15/15 -- follow-up visit for severe COPD, asthmatic component. He has been on [place]orza, has only been using Breo every few days because it is giving him throat irritation. He has been breathing well. Has been  having some fatigue that he relates to BP regimen. He has gained wt, about 40 lbs. Denies any change in cough or any wheeze at this time. He is having some rhinorrhea, occasionally productive cough.    EXAM:  Filed Vitals:   03/15/15 1438  BP: 116/76  Pulse: 73  Height: 6\' 1"  (1.854 m)  Weight: 215 lb (97.523 kg)  SpO2: 92%   Gen: Pleasant, well-nourished, in no distress,  normal affect  ENT: No lesions,  mouth clear,  oropharynx clear, no postnasal drip  Neck: No JVD, no TMG, no carotid bruits  Lungs: diminished BS in bases ,   Cardiovascular: RRR, heart sounds normal, no murmur or gallops, no peripheral edema  Musculoskeletal: No deformities, no cyanosis or clubbing  Neuro: alert, non focal  Skin: Warm, no lesions or rashes   COPD (chronic obstructive pulmonary disease) Appears to be stable at this time. He is probably having throat irritation from the breo, may benefit from a change - possibly ANoro or stiolto in place of both breo and [place]. His insurance will change in March, will wait until after this to order an alternative based on new formulary

## 2015-03-28 ENCOUNTER — Other Ambulatory Visit: Payer: Self-pay | Admitting: Emergency Medicine

## 2015-04-03 ENCOUNTER — Telehealth: Payer: Self-pay | Admitting: Emergency Medicine

## 2015-04-03 NOTE — Telephone Encounter (Signed)
Per 03/15/15 OV: Patient Instructions       Please continue your tudorza twice a day   We will continue  Breo daily for now.   We will consider changing your inhaled medications after your insurance changes in March, depending on which meds they cover.   Use albuterol as needed for shortness of breath.  Follow with Dr Delton Coombes in 4 months or sooner if you have any problems  ---  Called spoke with pt. Made aware RB stated to continue the breo for now. No changes will be made until after insurance changes in march. He verbalized understanding and had no questions.

## 2015-04-05 ENCOUNTER — Telehealth: Payer: Self-pay | Admitting: Emergency Medicine

## 2015-04-05 MED ORDER — TRIAMCINOLONE ACETONIDE 55 MCG/ACT NA AERO: 2.0000 | INHALATION_SPRAY | Freq: Every day | NASAL | Status: AC

## 2015-04-05 NOTE — Telephone Encounter (Signed)
Spoke with the pt  He is not wheezing  I advised him of recs per RB  He verbalized understanding and rx was sent

## 2015-04-05 NOTE — Telephone Encounter (Signed)
OK to refill nasacort 2 sprays each side qd Also let him know that he should try Tylenol cold and flu as directed  Ask him if he is wheezing - if so then I would like for him to take a pred taper >> Take  daily for 3 days, then  daily for 3 days, then  daily for 3 days, then  daily for 3 days, then stop. If not then we will hold off. He needs to call us if he develops colored mucous, wheeze, more SOB, etc.

## 2015-04-05 NOTE — Telephone Encounter (Signed)
Patient calling because he is catching a cold.  He says that he needs medication called into pharmacy for this.  He says his nose is stuffy, spitting out a thick, white mucus.  Patient also requesting a refill on Nasacort, has not used it since 2013, would like to get back on this medication.  Pharmacy: [facility] - [address], [place]  No Known Allergies

## 2015-04-10 ENCOUNTER — Other Ambulatory Visit: Payer: Self-pay | Admitting: Emergency Medicine

## 2015-04-13 ENCOUNTER — Telehealth: Payer: Self-pay | Admitting: Emergency Medicine

## 2015-04-13 NOTE — Telephone Encounter (Signed)
Received PA request for [place]. I am unfamiliar with [facility], called [facility] at [phone] and spoke to [place] and requested the number to contact pt's insurance [employer] and do PA over the phone or fax PA form to complete. Called [employer] at [phone] and received a VM stating [employer] is currently closed. WCB. Will forward to [place] to be followed up on next week.

## 2015-04-17 NOTE — Telephone Encounter (Signed)
PA started through [facility]. Key: [facility] Will await decision.

## 2015-04-18 NOTE — Telephone Encounter (Signed)
Per CMM:  Your request has been approved  Effective from 04/17/2015 through 03/02/2038 ---  ATC [facility] and was on hold x 10 min and never able to get anyone on the phone. Will need to call back Called pt also and LMTCB x1 to make aware of approval. Will forward to [place] to f/u on

## 2015-04-24 NOTE — Telephone Encounter (Signed)
lmtcb x2 with pt to call back.

## 2015-04-25 NOTE — Telephone Encounter (Signed)
Called spoke with pt. He is aware of approval. Nothing further needed

## 2015-04-25 NOTE — Telephone Encounter (Signed)
Patient Returned call [phone]

## 2015-07-16 ENCOUNTER — Ambulatory Visit: Payer: BLUE CROSS/BLUE SHIELD | Admitting: Emergency Medicine

## 2015-07-20 ENCOUNTER — Other Ambulatory Visit: Payer: Self-pay | Admitting: Emergency Medicine

## 2015-12-28 ENCOUNTER — Other Ambulatory Visit: Payer: Self-pay | Admitting: Emergency Medicine
